# Patient Record
Sex: Female | Born: 1990 | Race: White | Hispanic: No | Marital: Married | Smoking: Never smoker
Health system: Southern US, Community
[De-identification: ages and names within clinical notes are randomized; demographics above are authoritative.]

## PROBLEM LIST (undated history)

## (undated) ENCOUNTER — Inpatient Hospital Stay (HOSPITAL_COMMUNITY): Payer: Self-pay

---

## 2017-07-15 ENCOUNTER — Other Ambulatory Visit: Payer: Self-pay

## 2017-07-15 ENCOUNTER — Emergency Department (HOSPITAL_COMMUNITY)
Admission: EM | Admit: 2017-07-15 | Discharge: 2017-07-15 | Disposition: A | Discharge status: Home / Self Care | Payer: BC Managed Care – PPO | Attending: Emergency Medicine | Admitting: Emergency Medicine

## 2017-07-15 ENCOUNTER — Encounter (HOSPITAL_COMMUNITY): Payer: Self-pay | Admitting: Emergency Medicine

## 2017-07-15 ENCOUNTER — Emergency Department (HOSPITAL_COMMUNITY): Payer: BC Managed Care – PPO

## 2017-07-15 DIAGNOSIS — Z3A01 Less than 8 weeks gestation of pregnancy: Secondary | ICD-10-CM | POA: Diagnosis not present

## 2017-07-15 DIAGNOSIS — O9989 Other specified diseases and conditions complicating pregnancy, childbirth and the puerperium: Secondary | ICD-10-CM | POA: Diagnosis not present

## 2017-07-15 DIAGNOSIS — R1031 Right lower quadrant pain: Secondary | ICD-10-CM | POA: Insufficient documentation

## 2017-07-15 LAB — URINALYSIS, ROUTINE W REFLEX MICROSCOPIC
BACTERIA UA: NONE SEEN
Bilirubin Urine: NEGATIVE
Glucose, UA: NEGATIVE mg/dL
Hgb urine dipstick: NEGATIVE
Ketones, ur: NEGATIVE mg/dL
Nitrite: NEGATIVE
Protein, ur: NEGATIVE mg/dL
SPECIFIC GRAVITY, URINE: 1.021 (ref 1.005–1.030)
pH: 7 (ref 5.0–8.0)

## 2017-07-15 LAB — COMPREHENSIVE METABOLIC PANEL
ALBUMIN: 4.1 g/dL (ref 3.5–5.0)
ALT: 24 U/L (ref 14–54)
AST: 22 U/L (ref 15–41)
Alkaline Phosphatase: 52 U/L (ref 38–126)
Anion gap: 8 (ref 5–15)
BILIRUBIN TOTAL: 0.4 mg/dL (ref 0.3–1.2)
BUN: 6 mg/dL (ref 6–20)
CO2: 24 mmol/L (ref 22–32)
Calcium: 9.2 mg/dL (ref 8.9–10.3)
Chloride: 105 mmol/L (ref 101–111)
Creatinine, Ser: 0.78 mg/dL (ref 0.44–1.00)
GFR calc Af Amer: >60 mL/min (ref >60)
GFR calc non Af Amer: >60 mL/min (ref >60)
GLUCOSE: 99 mg/dL (ref 65–99)
POTASSIUM: 4 mmol/L (ref 3.5–5.1)
SODIUM: 137 mmol/L (ref 135–145)
Total Protein: 6.8 g/dL (ref 6.5–8.1)

## 2017-07-15 LAB — LIPASE, BLOOD: Lipase: 34 U/L (ref 11–51)

## 2017-07-15 LAB — I-STAT BETA HCG BLOOD, ED (MC, WL, AP ONLY)

## 2017-07-15 LAB — CBC
HEMATOCRIT: 40.8 % (ref 36.0–46.0)
HEMOGLOBIN: 13.7 g/dL (ref 12.0–15.0)
MCH: 29.7 pg (ref 26.0–34.0)
MCHC: 33.6 g/dL (ref 30.0–36.0)
MCV: 88.3 fL (ref 78.0–100.0)
Platelets: 201 10*3/uL (ref 150–400)
RBC: 4.62 MIL/uL (ref 3.87–5.11)
RDW: 11.9 % (ref 11.5–15.5)
WBC: 6.8 10*3/uL (ref 4.0–10.5)

## 2017-07-15 NOTE — ED Provider Notes (Signed)
[facility] EMERGENCY DEPARTMENT Provider Note   CSN: [id number] Arrival date & time: 07/15/17  0827     History   Chief Complaint Chief Complaint  Patient presents with  . Groin Pain    HPI Donna Cameron is a 27 y.o. female with no significant past medical history, who presents to ED for evaluation of sharp lower right-sided pain.  Symptoms yesterday but they improved there is.  She woke up this morning with similar but worse pain.  She she has not taken any medications to help with her symptoms.  States that her last menstrual cycle was on April 26.  She has taken a number of positive pregnancy test in the past several weeks.  This be her first pregnancy.  She is scheduled to meet with her OB/GYN next week.  She denies any vaginal discharge, abnormal vaginal bleeding, vomiting, diarrhea, constipation, fevers, dysuria, hematuria.  HPI  History reviewed. No pertinent past medical history.  There are no active problems to display for this patient.   History reviewed. No pertinent surgical history.   OB History    Gravida  1   Para      Term      Preterm      AB      Living        SAB      TAB      Ectopic      Multiple      Live Births               Home Medications    Prior to Admission medications   Not on File    Family History No family history on file.  Social History Social History   Tobacco Use  . Smoking status: Not on file  Substance Use Topics  . Alcohol use: Not on file  . Drug use: Not on file     Allergies   Patient has no known allergies.   Review of Systems Review of Systems  Constitutional: Negative for appetite change, chills and fever.  HENT: Negative for ear pain, rhinorrhea, sneezing and sore throat.   Eyes: Negative for photophobia and visual disturbance.  Respiratory: Negative for cough, chest tightness, shortness of breath and wheezing.   Cardiovascular: Negative for chest pain and  palpitations.  Gastrointestinal: Positive for abdominal pain. Negative for blood in stool, constipation, diarrhea, nausea and vomiting.  Genitourinary: Negative for dysuria, flank pain, hematuria, urgency, vaginal bleeding, vaginal discharge and vaginal pain.  Musculoskeletal: Negative for myalgias.  Skin: Negative for rash.  Neurological: Negative for dizziness, weakness and light-headedness.     Physical Exam Updated Vital Signs BP 113/69   Pulse 74   Temp 98.2 F (36.8 C) (Oral)   Resp 14   Ht 5' 5.25" (1.657 m)   Wt 79.4 kg (175 lb)   LMP 06/06/2017 (Exact Date)   SpO2 100%   BMI 28.90 kg/m   Physical Exam  Constitutional: She appears well-developed and well-nourished. No distress.  HENT:  Head: Normocephalic and atraumatic.  Nose: Nose normal.  Eyes: Conjunctivae and EOM are normal. Right eye exhibits no discharge. Left eye exhibits no discharge. No scleral icterus.  Neck: Normal range of motion. Neck supple.  Cardiovascular: Normal rate, regular rhythm, normal heart sounds and intact distal pulses. Exam reveals no gallop and no friction rub.  No murmur heard. Pulmonary/Chest: Effort normal and breath sounds normal. No respiratory distress.  Abdominal: Soft. Bowel sounds are normal. She exhibits  no distension. There is no tenderness. There is no guarding.  No abdominal tenderness to palpation.  No CVA tenderness.  Musculoskeletal: Normal range of motion. She exhibits no edema.  Neurological: She is alert. She exhibits normal muscle tone. Coordination normal.  Skin: Skin is warm and dry. No rash noted.  Psychiatric: She has a normal mood and affect.  Nursing note and vitals reviewed.    ED Treatments / Results  Labs (all labs ordered are listed, but only abnormal results are displayed) Labs Reviewed  URINALYSIS, ROUTINE W REFLEX MICROSCOPIC - Abnormal; Notable for the following components:      Result Value   APPearance HAZY (*)    Leukocytes, UA TRACE (*)     All other components within normal limits  I-STAT BETA HCG BLOOD, ED ([facility], [facility], [facility] ONLY) - Abnormal; Notable for the following components:   I-stat hCG, quantitative >2,000.0 (*)    All other components within normal limits  URINE CULTURE  LIPASE, BLOOD  COMPREHENSIVE METABOLIC PANEL  CBC    EKG None  Radiology US Ob Comp < 14 Wks  Result Date: 07/15/2017 CLINICAL DATA:  Right pelvic pain. EXAM: OBSTETRIC <14 WK [place] AND TRANSVAGINAL OB US TECHNIQUE: Both transabdominal and transvaginal ultrasound examinations were performed for complete evaluation of the gestation as well as the maternal uterus, adnexal regions, and pelvic cul-de-sac. Transvaginal technique was performed to assess early pregnancy. COMPARISON:  None. FINDINGS: Intrauterine gestational sac: Single Yolk sac:  Visualized. Embryo:  Not Visualized. MSD: 7.3 mm   5 w   3 d Subchorionic hemorrhage:  None visualized. Maternal uterus/adnexae: Probable small fibroid in the posterior uterus. The ovaries are normal in appearance. IMPRESSION: Single IUP. A gestational sac and yolk sac are seen. No fetal pole at this time. Electronically Signed   By: Dorise Bullion III M.D   On: 07/15/2017 11:00   US Ob Transvaginal  Result Date: 07/15/2017 CLINICAL DATA:  Right pelvic pain. EXAM: OBSTETRIC <14 WK [place] AND TRANSVAGINAL OB US TECHNIQUE: Both transabdominal and transvaginal ultrasound examinations were performed for complete evaluation of the gestation as well as the maternal uterus, adnexal regions, and pelvic cul-de-sac. Transvaginal technique was performed to assess early pregnancy. COMPARISON:  None. FINDINGS: Intrauterine gestational sac: Single Yolk sac:  Visualized. Embryo:  Not Visualized. MSD: 7.3 mm   5 w   3 d Subchorionic hemorrhage:  None visualized. Maternal uterus/adnexae: Probable small fibroid in the posterior uterus. The ovaries are normal in appearance. IMPRESSION: Single IUP. A gestational sac and yolk sac are seen. No fetal pole at  this time. Electronically Signed   By: Dorise Bullion III M.D   On: 07/15/2017 11:00    Procedures Procedures (including critical care time)  Medications Ordered in ED Medications - No data to display   Initial Impression / Assessment and Plan / ED Course  I have reviewed the triage vital signs and the nursing notes.  Pertinent labs & imaging results that were available during my care of the patient were reviewed by me and considered in my medical decision making (see chart for details).     Patient presents to ED for evaluation of sharp lower right-sided domino pain.  Symptoms were evident yesterday but improved without any measures.  She woke up this morning with similar but worse pain.  Has not taken any medications to help with her symptoms.  Her last menstrual cycle was approximately 6 weeks ago.  She has taken numerous pregnancy tests that were all  positive in the past few weeks.  This will be her first pregnancy.  On physical exam she is overall well-appearing.  She has no abdominal tenderness to palpation and no CVA tenderness noted.  Vital signs within normal limits.  Patient's lab work significant for hCG levels greater than 2000.  CBC, BMP, lipase unremarkable.  Urine shows trace leukocytes but negative bacteria and nitrates.  Will send for culture.  Patient denies any dysuria at this time.  Pelvic ultrasound shows single IUP at 5 weeks and 3 days.  I doubt appendicitis, other surgical or emergent cause of her symptoms based on unremarkable and reassuring lab work and vital signs. Patient is currently on prenatal vitamins and folic acid.  Encouraged her to follow-up with her OB/GYN for further evaluation and to establish care.  Advised to return to ED for any severe worsening symptoms.  Portions of this note were generated with [employer]. Dictation errors may occur despite best attempts at proofreading.   Final Clinical Impressions(s) / ED Diagnoses   Final  diagnoses:  Less than [redacted] weeks gestation of pregnancy    ED Discharge Orders    None       Delia Heady, PA-C 07/15/17 [place], MD 07/15/17 1620

## 2017-07-15 NOTE — ED Notes (Addendum)
Pt stable, ambulatory, and verbalizes understanding of d/c instructions.  Signature pad not working, pt unable to sign.

## 2017-07-15 NOTE — ED Triage Notes (Signed)
Patient six weeks pregnant, has not seen OB, does not have confirmed due date. Complains of intermittent sharp pain in right groin, states she thinks the pain is her ovary that started in the middle of the night last night.

## 2017-07-15 NOTE — ED Notes (Signed)
Patient transported to Ultrasound 

## 2017-07-15 NOTE — Discharge Instructions (Signed)
Your ultrasound today showed a single gestational sac at 5 weeks and 3 days. Follow-up with your OB/GYN for further evaluation. Take Tylenol as needed for pain. Return to ED for worsening symptoms, vaginal bleeding, increased vomiting, lightheadedness or loss of consciousness.

## 2017-07-16 LAB — URINE CULTURE

## 2017-08-02 LAB — OB RESULTS CONSOLE ANTIBODY SCREEN: ANTIBODY SCREEN: NEGATIVE

## 2017-08-02 LAB — OB RESULTS CONSOLE GC/CHLAMYDIA
Chlamydia: NEGATIVE
Gonorrhea: NEGATIVE

## 2017-08-02 LAB — OB RESULTS CONSOLE ABO/RH: RH TYPE: POSITIVE

## 2017-08-02 LAB — OB RESULTS CONSOLE HEPATITIS B SURFACE ANTIGEN: Hepatitis B Surface Ag: NEGATIVE

## 2017-08-02 LAB — OB RESULTS CONSOLE RPR: RPR: NONREACTIVE

## 2017-08-02 LAB — OB RESULTS CONSOLE HIV ANTIBODY (ROUTINE TESTING): HIV: NONREACTIVE

## 2017-08-02 LAB — OB RESULTS CONSOLE RUBELLA ANTIBODY, IGM: RUBELLA: IMMUNE

## 2017-11-09 ENCOUNTER — Inpatient Hospital Stay (HOSPITAL_COMMUNITY): Payer: BC Managed Care – PPO

## 2017-11-09 ENCOUNTER — Other Ambulatory Visit: Payer: Self-pay

## 2017-11-09 ENCOUNTER — Observation Stay (HOSPITAL_COMMUNITY)
Admission: AD | Admit: 2017-11-09 | Discharge: 2017-11-10 | Disposition: A | Discharge status: Home / Self Care | Payer: BC Managed Care – PPO | Source: Ambulatory Visit | Attending: Obstetrics and Gynecology | Admitting: Obstetrics and Gynecology

## 2017-11-09 ENCOUNTER — Encounter (HOSPITAL_COMMUNITY): Payer: Self-pay | Admitting: *Deleted

## 2017-11-09 DIAGNOSIS — O2302 Infections of kidney in pregnancy, second trimester: Secondary | ICD-10-CM | POA: Diagnosis not present

## 2017-11-09 DIAGNOSIS — N12 Tubulo-interstitial nephritis, not specified as acute or chronic: Secondary | ICD-10-CM | POA: Diagnosis present

## 2017-11-09 DIAGNOSIS — O26892 Other specified pregnancy related conditions, second trimester: Secondary | ICD-10-CM

## 2017-11-09 DIAGNOSIS — O9989 Other specified diseases and conditions complicating pregnancy, childbirth and the puerperium: Principal | ICD-10-CM | POA: Insufficient documentation

## 2017-11-09 DIAGNOSIS — Z3A22 22 weeks gestation of pregnancy: Secondary | ICD-10-CM | POA: Diagnosis not present

## 2017-11-09 DIAGNOSIS — Z79899 Other long term (current) drug therapy: Secondary | ICD-10-CM | POA: Diagnosis not present

## 2017-11-09 DIAGNOSIS — R109 Unspecified abdominal pain: Secondary | ICD-10-CM | POA: Insufficient documentation

## 2017-11-09 LAB — URINALYSIS, ROUTINE W REFLEX MICROSCOPIC
Bilirubin Urine: NEGATIVE
Glucose, UA: NEGATIVE mg/dL
Ketones, ur: 80 mg/dL — AB
Nitrite: NEGATIVE
PROTEIN: NEGATIVE mg/dL
Specific Gravity, Urine: 1.023 (ref 1.005–1.030)
pH: 5 (ref 5.0–8.0)

## 2017-11-09 LAB — ABO/RH: ABO/RH(D): B POS

## 2017-11-09 LAB — COMPREHENSIVE METABOLIC PANEL
ALBUMIN: 3.7 g/dL (ref 3.5–5.0)
ALT: 12 U/L (ref 0–44)
ANION GAP: 11 (ref 5–15)
AST: 17 U/L (ref 15–41)
Alkaline Phosphatase: 53 U/L (ref 38–126)
BILIRUBIN TOTAL: 0.8 mg/dL (ref 0.3–1.2)
BUN: 11 mg/dL (ref 6–20)
CALCIUM: 9.2 mg/dL (ref 8.9–10.3)
CO2: 20 mmol/L — AB (ref 22–32)
Chloride: 105 mmol/L (ref 98–111)
Creatinine, Ser: 0.69 mg/dL (ref 0.44–1.00)
GFR calc Af Amer: >60 mL/min (ref >60)
GFR calc non Af Amer: >60 mL/min (ref >60)
GLUCOSE: 106 mg/dL — AB (ref 70–99)
Potassium: 3.8 mmol/L (ref 3.5–5.1)
Sodium: 136 mmol/L (ref 135–145)
TOTAL PROTEIN: 7.2 g/dL (ref 6.5–8.1)

## 2017-11-09 LAB — CBC WITH DIFFERENTIAL/PLATELET
BASOS PCT: 0 %
Basophils Absolute: 0 10*3/uL (ref 0.0–0.1)
Eosinophils Absolute: 0 10*3/uL (ref 0.0–0.7)
Eosinophils Relative: 0 %
HEMATOCRIT: 34.5 % — AB (ref 36.0–46.0)
Hemoglobin: 12.1 g/dL (ref 12.0–15.0)
Lymphocytes Relative: 9 %
Lymphs Abs: 1.2 10*3/uL (ref 0.7–4.0)
MCH: 31.2 pg (ref 26.0–34.0)
MCHC: 35.1 g/dL (ref 30.0–36.0)
MCV: 88.9 fL (ref 78.0–100.0)
MONO ABS: 0.3 10*3/uL (ref 0.1–1.0)
MONOS PCT: 2 %
NEUTROS ABS: 12.1 10*3/uL — AB (ref 1.7–7.7)
Neutrophils Relative %: 89 %
Platelets: 197 10*3/uL (ref 150–400)
RBC: 3.88 MIL/uL (ref 3.87–5.11)
RDW: 13.4 % (ref 11.5–15.5)
WBC: 13.7 10*3/uL — ABNORMAL HIGH (ref 4.0–10.5)

## 2017-11-09 LAB — TYPE AND SCREEN
ABO/RH(D): B POS
ANTIBODY SCREEN: NEGATIVE

## 2017-11-09 LAB — WET PREP, GENITAL
CLUE CELLS WET PREP: NONE SEEN
Sperm: NONE SEEN
Trich, Wet Prep: NONE SEEN
YEAST WET PREP: NONE SEEN

## 2017-11-09 MED ORDER — PIPERACILLIN-TAZOBACTAM 3.375 G IVPB 30 MIN
3.3750 g | Freq: Once | INTRAVENOUS | Status: AC
  Administered 2017-11-09: 3.375 g via INTRAVENOUS
  Filled 2017-11-09: qty 50

## 2017-11-09 MED ORDER — ONDANSETRON 8 MG PO TBDP
8.0000 mg | ORAL_TABLET | Freq: Once | ORAL | Status: AC
  Administered 2017-11-09: 8 mg via ORAL
  Filled 2017-11-09: qty 1

## 2017-11-09 MED ORDER — ACETAMINOPHEN 325 MG PO TABS
650.0000 mg | ORAL_TABLET | ORAL | Status: DC | PRN
  Administered 2017-11-09 – 2017-11-10 (×2): 650 mg via ORAL
  Filled 2017-11-09 (×2): qty 2

## 2017-11-09 MED ORDER — HYDROMORPHONE HCL 1 MG/ML IJ SOLN
1.0000 mg | Freq: Once | INTRAMUSCULAR | Status: AC
  Administered 2017-11-09: 1 mg via INTRAVENOUS
  Filled 2017-11-09: qty 1

## 2017-11-09 MED ORDER — ACETAMINOPHEN 325 MG PO TABS: 650.0000 mg | ORAL_TABLET | ORAL | Status: DC | PRN

## 2017-11-09 MED ORDER — ZOLPIDEM TARTRATE 5 MG PO TABS: 5.0000 mg | ORAL_TABLET | Freq: Every evening | ORAL | Status: DC | PRN

## 2017-11-09 MED ORDER — PRENATAL MULTIVITAMIN CH: 1.0000 | ORAL_TABLET | Freq: Every day | ORAL | Status: DC

## 2017-11-09 MED ORDER — PIPERACILLIN-TAZOBACTAM 3.375 G IVPB
3.3750 g | Freq: Three times a day (TID) | INTRAVENOUS | Status: DC
  Administered 2017-11-09 – 2017-11-10 (×3): 3.375 g via INTRAVENOUS
  Filled 2017-11-09 (×2): qty 50

## 2017-11-09 MED ORDER — HYDROCODONE-ACETAMINOPHEN 5-325 MG PO TABS
2.0000 | ORAL_TABLET | Freq: Once | ORAL | Status: DC
  Filled 2017-11-09: qty 2

## 2017-11-09 MED ORDER — HYDROMORPHONE HCL 1 MG/ML IJ SOLN: 1.0000 mg | INTRAMUSCULAR | Status: DC | PRN

## 2017-11-09 MED ORDER — DOCUSATE SODIUM 100 MG PO CAPS
100.0000 mg | ORAL_CAPSULE | Freq: Every day | ORAL | Status: DC
  Administered 2017-11-10: 100 mg via ORAL
  Filled 2017-11-09: qty 1

## 2017-11-09 MED ORDER — CALCIUM CARBONATE ANTACID 500 MG PO CHEW: 2.0000 | CHEWABLE_TABLET | ORAL | Status: DC | PRN

## 2017-11-09 MED ORDER — LACTATED RINGERS IV SOLN
INTRAVENOUS | Status: DC
  Administered 2017-11-09 – 2017-11-10 (×2): via INTRAVENOUS

## 2017-11-09 MED ORDER — LACTATED RINGERS IV SOLN
INTRAVENOUS | Status: DC
  Administered 2017-11-09: 14:00:00 via INTRAVENOUS

## 2017-11-09 NOTE — MAU Note (Signed)
Patient reports sudden onset of severe left side flank pain at 1100 today.  Denies bleeding in urine.  Reports she thought it was the beginning of a UTI with some urinary frequency.

## 2017-11-09 NOTE — Progress Notes (Signed)
Pharmacy Antibiotic Note  Donna Cameron is a 27 y.o. female admitted on 11/09/2017 with pyelonephritis.  Pharmacy has been consulted for Zosyn dosing.  Plan: Zosyn 3.375 grams IV x1 over 30 min followed by Zosyn 3.375g IV q8h (4 hour infusion).     Temp (24hrs), Avg:98.3 F (36.8 C), Min:98.3 F (36.8 C), Max:98.3 F (36.8 C)  Recent Labs  Lab 11/09/17 1336  WBC 13.7*  CREATININE 0.69    CrCl cannot be calculated (Unknown ideal weight.).    No Known Allergies  Antimicrobials this admission: Zosyn 9/27 >>     Microbiology results: 11/09/17 UCx: pending  Thank you for allowing pharmacy to be a part of this patient's care.  Beryle Lathe 11/09/2017 3:40 PM

## 2017-11-09 NOTE — Progress Notes (Signed)
Patient now much more comfortable-took some Tylenol for mild low back pain>now OK  Vitals:   11/09/17 1600 11/09/17 1957  BP: 122/78 114/67  Pulse: (!) 101 100  Resp:  17  Temp: 98.5 F (36.9 C) 98.7 F (37.1 C)  SpO2: 100% 97%   Abdomen soft, NT Uterus NT Left flank NT  A/P: Flank pain-possible early pyelo vs small kidney stone         Continue IV fluids and ATB         Urine C&S sent         CBC in am         D/W patient

## 2017-11-09 NOTE — MAU Provider Note (Signed)
History     CSN: [id number]  Arrival date and time: 11/09/17 1235   First Provider Initiated Contact with Patient 11/09/17 1307      Chief Complaint  Patient presents with  . Flank Pain   HPI  Donna Cameron is a 27 y.o. G1P0 at [redacted]w[redacted]d who presents to MAU with chief complaint of severe left flank pain, radiating to left lower back, rated 9/10. This is a new problem, onset today at approximately 1100hrs. Denies urinary symptoms. Denies vaginal bleeding, leaking of fluid, decreased fetal movement, fever, falls, or recent illness.  Patient also complains of nausea/vomiting, new onset on arrival in MAU.   OB History    Gravida  1   Para      Term      Preterm      AB      Living        SAB      TAB      Ectopic      Multiple      Live Births              History reviewed. No pertinent past medical history.  History reviewed. No pertinent surgical history.  History reviewed. No pertinent family history.  Social History   Tobacco Use  . Smoking status: Never Smoker  . Smokeless tobacco: Never Used  Substance Use Topics  . Alcohol use: Not Currently  . Drug use: Never    Allergies: No Known Allergies  Medications Prior to Admission  Medication Sig Dispense Refill Last Dose  . Prenatal Vit-Fe Fumarate-FA (PRENATAL MULTIVITAMIN) TABS tablet Take 1 tablet by mouth daily at 12 noon.   11/09/2017 at Unknown time    Review of Systems  Constitutional: Negative for chills and fever.  Gastrointestinal: Positive for nausea and vomiting. Negative for abdominal pain.  Genitourinary: Negative for vaginal bleeding, vaginal discharge and vaginal pain.  Musculoskeletal: Positive for back pain.  All other systems reviewed and are negative.  Physical Exam   Pulse (!) 104, temperature 98.3 F (36.8 C), resp. rate 18, last menstrual period 06/06/2017, SpO2 96 %.  Physical Exam  Nursing note and vitals reviewed. Constitutional: She appears well-developed and  well-nourished.  Respiratory: Effort normal.  GI: She exhibits no distension. There is no tenderness. There is CVA tenderness. There is no rebound and no guarding.  Musculoskeletal: She exhibits tenderness.   Patient is barely able to tolerate light pressure to palpation on her left midback and LLQ. Curled in fetal position upon initial assessment.  MAU Course  Procedures  MDM Patient Vitals for the past 24 hrs:  Temp Pulse Resp SpO2  11/09/17 1252 98.3 F (36.8 C) (!) 104 18 96 %   Results for orders placed or performed during the hospital encounter of 11/09/17 (from the past 24 hour(s))  Wet prep, genital     Status: Abnormal   Collection Time: 11/09/17  1:33 PM  Result Value Ref Range   Yeast Wet Prep HPF POC NONE SEEN NONE SEEN   Trich, Wet Prep NONE SEEN NONE SEEN   Clue Cells Wet Prep HPF POC NONE SEEN NONE SEEN   WBC, Wet Prep HPF POC FEW (A) NONE SEEN   Sperm NONE SEEN   CBC with Differential/Platelet     Status: Abnormal   Collection Time: 11/09/17  1:36 PM  Result Value Ref Range   WBC 13.7 (H) 4.0 - 10.5 K/uL   RBC 3.88 3.87 - 5.11 MIL/uL   Hemoglobin 12.1  12.0 - 15.0 g/dL   HCT 34.5 (L) 36.0 - 46.0 %   MCV 88.9 78.0 - 100.0 fL   MCH 31.2 26.0 - 34.0 pg   MCHC 35.1 30.0 - 36.0 g/dL   RDW 13.4 11.5 - 15.5 %   Platelets 197 150 - 400 K/uL   Neutrophils Relative % 89 %   Neutro Abs 12.1 (H) 1.7 - 7.7 K/uL   Lymphocytes Relative 9 %   Lymphs Abs 1.2 0.7 - 4.0 K/uL   Monocytes Relative 2 %   Monocytes Absolute 0.3 0.1 - 1.0 K/uL   Eosinophils Relative 0 %   Eosinophils Absolute 0.0 0.0 - 0.7 K/uL   Basophils Relative 0 %   Basophils Absolute 0.0 0.0 - 0.1 K/uL  Comprehensive metabolic panel     Status: Abnormal   Collection Time: 11/09/17  1:36 PM  Result Value Ref Range   Sodium 136 135 - 145 mmol/L   Potassium 3.8 3.5 - 5.1 mmol/L   Chloride 105 98 - 111 mmol/L   CO2 20 (L) 22 - 32 mmol/L   Glucose, Bld 106 (H) 70 - 99 mg/dL   BUN 11 6 - 20 mg/dL    Creatinine, Ser 0.69 0.44 - 1.00 mg/dL   Calcium 9.2 8.9 - 10.3 mg/dL   Total Protein 7.2 6.5 - 8.1 g/dL   Albumin 3.7 3.5 - 5.0 g/dL   AST 17 15 - 41 U/L   ALT 12 0 - 44 U/L   Alkaline Phosphatase 53 38 - 126 U/L   Total Bilirubin 0.8 0.3 - 1.2 mg/dL   GFR calc non Af Amer >60 >60 mL/min   GFR calc Af Amer >60 >60 mL/min   Anion gap 11 5 - 15  Urinalysis, Routine w reflex microscopic     Status: Abnormal   Collection Time: 11/09/17  2:07 PM  Result Value Ref Range   Color, Urine YELLOW YELLOW   APPearance HAZY (A) CLEAR   Specific Gravity, Urine 1.023 1.005 - 1.030   pH 5.0 5.0 - 8.0   Glucose, UA NEGATIVE NEGATIVE mg/dL   Hgb urine dipstick MODERATE (A) NEGATIVE   Bilirubin Urine NEGATIVE NEGATIVE   Ketones, ur 80 (A) NEGATIVE mg/dL   Protein, ur NEGATIVE NEGATIVE mg/dL   Nitrite NEGATIVE NEGATIVE   Leukocytes, UA TRACE (A) NEGATIVE   RBC / HPF 6-10 0 - 5 RBC/hpf   WBC, UA 6-10 0 - 5 WBC/hpf   Bacteria, UA RARE (A) NONE SEEN   Squamous Epithelial / LPF 6-10 0 - 5   Mucus PRESENT    US Renal  Result Date: 11/09/2017 CLINICAL DATA:  Severe LEFT leg pain. Patient at 22 weeks 2 days gestational age. EXAM: RENAL / URINARY TRACT ULTRASOUND COMPLETE COMPARISON:  07/15/2017 OB ultrasound FINDINGS: Right Kidney: Length: 11.3 centimeters. Echogenicity within normal limits. No mass or hydronephrosis visualized. Left Kidney: Length: 11.2 centimeters. Echogenicity within normal limits. No mass or hydronephrosis visualized. Bladder: Decompressed and not evaluable. IMPRESSION: Normal appearance of both kidneys.  No hydronephrosis. Electronically Signed   By: Nolon Nations M.D.   On: 11/09/2017 15:09    Assessment and Plan  --27 y.o. G1P0 @ [redacted]w[redacted]d  --No evidence of kidney stone on renal US --Discussed lab results with Dr. Gaetano Net, concern for Pyelonephritis --Flank pain reduced to 3/10 with IV Dilaudid administration --Urine culture pending, will forward results to [facility] and follow up  PRN --Admit to Antepartum for 12-24 hours observation, IV antibiotics and pain management. Orders  placed  Darlina Rumpf, CNM 11/09/2017, 4:05 PM

## 2017-11-09 NOTE — H&P (Signed)
Donna Cameron is a 27 y.o. G1P0 at [redacted]w[redacted]d who presents to MAU with chief complaint of severe left flank pain, radiating to left lower back, rated 9/10. This is a new problem, onset today at approximately 1100hrs. Denies urinary symptoms. Denies vaginal bleeding, leaking of fluid, decreased fetal movement, fever, falls, or recent illness.  Patient also complains of nausea/vomiting, new onset on arrival in MAU.           OB History    Gravida  1   Para      Term      Preterm      AB      Living        SAB      TAB      Ectopic      Multiple      Live Births              History reviewed. No pertinent past medical history.  History reviewed. No pertinent surgical history.  History reviewed. No pertinent family history.  Social History       Tobacco Use  . Smoking status: Never Smoker  . Smokeless tobacco: Never Used  Substance Use Topics  . Alcohol use: Not Currently  . Drug use: Never    Allergies: No Known Allergies         Medications Prior to Admission  Medication Sig Dispense Refill Last Dose  . Prenatal Vit-Fe Fumarate-FA (PRENATAL MULTIVITAMIN) TABS tablet Take 1 tablet by mouth daily at 12 noon.   11/09/2017 at Unknown time    Review of Systems  Constitutional: Negative for chills and fever.  Gastrointestinal: Positive for nausea and vomiting. Negative for abdominal pain.  Genitourinary: Negative for vaginal bleeding, vaginal discharge and vaginal pain.  Musculoskeletal: Positive for back pain.  All other systems reviewed and are negative.  Physical Exam   Pulse (!) 104, temperature 98.3 F (36.8 C), resp. rate 18, last menstrual period 06/06/2017, SpO2 96 %.  Physical Exam  Nursing note and vitals reviewed. Constitutional: She appears well-developed and well-nourished.  Respiratory: Effort normal.  GI: She exhibits no distension. There is no tenderness. There is CVA tenderness. There is no rebound and no guarding.   Musculoskeletal: She exhibits tenderness.   Patient is barely able to tolerate light pressure to palpation on her left midback and LLQ. Curled in fetal position upon initial assessment.  MAU Course  Procedures  MDM Patient Vitals for the past 24 hrs:  Temp Pulse Resp SpO2  11/09/17 1252 98.3 F (36.8 C) (!) 104 18 96 %   LabResultsLast24Hours       Results for orders placed or performed during the hospital encounter of 11/09/17 (from the past 24 hour(s))  Wet prep, genital     Status: Abnormal   Collection Time: 11/09/17  1:33 PM  Result Value Ref Range   Yeast Wet Prep HPF POC NONE SEEN NONE SEEN   Trich, Wet Prep NONE SEEN NONE SEEN   Clue Cells Wet Prep HPF POC NONE SEEN NONE SEEN   WBC, Wet Prep HPF POC FEW (A) NONE SEEN   Sperm NONE SEEN   CBC with Differential/Platelet     Status: Abnormal   Collection Time: 11/09/17  1:36 PM  Result Value Ref Range   WBC 13.7 (H) 4.0 - 10.5 K/uL   RBC 3.88 3.87 - 5.11 MIL/uL   Hemoglobin 12.1 12.0 - 15.0 g/dL   HCT 34.5 (L) 36.0 - 46.0 %   MCV 88.9  78.0 - 100.0 fL   MCH 31.2 26.0 - 34.0 pg   MCHC 35.1 30.0 - 36.0 g/dL   RDW 13.4 11.5 - 15.5 %   Platelets 197 150 - 400 K/uL   Neutrophils Relative % 89 %   Neutro Abs 12.1 (H) 1.7 - 7.7 K/uL   Lymphocytes Relative 9 %   Lymphs Abs 1.2 0.7 - 4.0 K/uL   Monocytes Relative 2 %   Monocytes Absolute 0.3 0.1 - 1.0 K/uL   Eosinophils Relative 0 %   Eosinophils Absolute 0.0 0.0 - 0.7 K/uL   Basophils Relative 0 %   Basophils Absolute 0.0 0.0 - 0.1 K/uL  Comprehensive metabolic panel     Status: Abnormal   Collection Time: 11/09/17  1:36 PM  Result Value Ref Range   Sodium 136 135 - 145 mmol/L   Potassium 3.8 3.5 - 5.1 mmol/L   Chloride 105 98 - 111 mmol/L   CO2 20 (L) 22 - 32 mmol/L   Glucose, Bld 106 (H) 70 - 99 mg/dL   BUN 11 6 - 20 mg/dL   Creatinine, Ser 0.69 0.44 - 1.00 mg/dL   Calcium 9.2 8.9 - 10.3 mg/dL   Total Protein 7.2  6.5 - 8.1 g/dL   Albumin 3.7 3.5 - 5.0 g/dL   AST 17 15 - 41 U/L   ALT 12 0 - 44 U/L   Alkaline Phosphatase 53 38 - 126 U/L   Total Bilirubin 0.8 0.3 - 1.2 mg/dL   GFR calc non Af Amer >60 >60 mL/min   GFR calc Af Amer >60 >60 mL/min   Anion gap 11 5 - 15  Urinalysis, Routine w reflex microscopic     Status: Abnormal   Collection Time: 11/09/17  2:07 PM  Result Value Ref Range   Color, Urine YELLOW YELLOW   APPearance HAZY (A) CLEAR   Specific Gravity, Urine 1.023 1.005 - 1.030   pH 5.0 5.0 - 8.0   Glucose, UA NEGATIVE NEGATIVE mg/dL   Hgb urine dipstick MODERATE (A) NEGATIVE   Bilirubin Urine NEGATIVE NEGATIVE   Ketones, ur 80 (A) NEGATIVE mg/dL   Protein, ur NEGATIVE NEGATIVE mg/dL   Nitrite NEGATIVE NEGATIVE   Leukocytes, UA TRACE (A) NEGATIVE   RBC / HPF 6-10 0 - 5 RBC/hpf   WBC, UA 6-10 0 - 5 WBC/hpf   Bacteria, UA RARE (A) NONE SEEN   Squamous Epithelial / LPF 6-10 0 - 5   Mucus PRESENT      US Renal  Result Date: 11/09/2017 CLINICAL DATA:  Severe LEFT leg pain. Patient at 22 weeks 2 days gestational age. EXAM: RENAL / URINARY TRACT ULTRASOUND COMPLETE COMPARISON:  07/15/2017 OB ultrasound FINDINGS: Right Kidney: Length: 11.3 centimeters. Echogenicity within normal limits. No mass or hydronephrosis visualized. Left Kidney: Length: 11.2 centimeters. Echogenicity within normal limits. No mass or hydronephrosis visualized. Bladder: Decompressed and not evaluable. IMPRESSION: Normal appearance of both kidneys.  No hydronephrosis. Electronically Signed   By: Nolon Nations M.D.   On: 11/09/2017 15:09    Assessment and Plan  --27 y.o. G1P0 @ [redacted]w[redacted]d  --No evidence of kidney stone on renal US -- Possible Pyelonephritis --Flank pain reduced to 3/10 with IV Dilaudid administration --Urine culture pending --Admit to Antepartum for 12-24 hours observation, IV antibiotics and pain management

## 2017-11-10 DIAGNOSIS — O2302 Infections of kidney in pregnancy, second trimester: Secondary | ICD-10-CM | POA: Diagnosis present

## 2017-11-10 LAB — CBC WITH DIFFERENTIAL/PLATELET
BASOS PCT: 0 %
Basophils Absolute: 0 10*3/uL (ref 0.0–0.1)
EOS ABS: 0.1 10*3/uL (ref 0.0–0.7)
Eosinophils Relative: 1 %
HCT: 29.4 % — ABNORMAL LOW (ref 36.0–46.0)
HEMOGLOBIN: 10.6 g/dL — AB (ref 12.0–15.0)
Lymphocytes Relative: 21 %
Lymphs Abs: 2.2 10*3/uL (ref 0.7–4.0)
MCH: 32 pg (ref 26.0–34.0)
MCHC: 36.1 g/dL — AB (ref 30.0–36.0)
MCV: 88.8 fL (ref 78.0–100.0)
Monocytes Absolute: 0.4 10*3/uL (ref 0.1–1.0)
Monocytes Relative: 4 %
NEUTROS PCT: 74 %
Neutro Abs: 7.7 10*3/uL (ref 1.7–7.7)
Platelets: 171 10*3/uL (ref 150–400)
RBC: 3.31 MIL/uL — AB (ref 3.87–5.11)
RDW: 13.5 % (ref 11.5–15.5)
WBC: 10.4 10*3/uL (ref 4.0–10.5)

## 2017-11-10 LAB — URINE CULTURE: Culture: NO GROWTH

## 2017-11-10 MED ORDER — CEPHALEXIN 500 MG PO CAPS: 500.0000 mg | ORAL_CAPSULE | Freq: Four times a day (QID) | ORAL | 0 refills | Status: DC

## 2017-11-10 MED ORDER — ACETAMINOPHEN 325 MG PO TABS: 650.0000 mg | ORAL_TABLET | Freq: Four times a day (QID) | ORAL | 1 refills | Status: DC | PRN

## 2017-11-10 NOTE — Progress Notes (Signed)
Feels great  Vitals:   11/10/17 0614 11/10/17 0757  BP: 98/61 (!) 102/58  Pulse: 97 92  Resp: 18 16  Temp: 98.5 F (36.9 C) 98.5 F (36.9 C)  SpO2: 99% 98%   Back no CVAT Flank NT Uterus soft NT  Results for orders placed or performed during the hospital encounter of 11/09/17 (from the past 48 hour(s))  Wet prep, genital     Status: Abnormal   Collection Time: 11/09/17  1:33 PM  Result Value Ref Range   Yeast Wet Prep HPF POC NONE SEEN NONE SEEN   Trich, Wet Prep NONE SEEN NONE SEEN   Clue Cells Wet Prep HPF POC NONE SEEN NONE SEEN   WBC, Wet Prep HPF POC FEW (A) NONE SEEN    Comment: MANY BACTERIA SEEN   Sperm NONE SEEN     Comment: Performed at [facility], [address] [place], [place] [address]  CBC with Differential/Platelet     Status: Abnormal   Collection Time: 11/09/17  1:36 PM  Result Value Ref Range   WBC 13.7 (H) 4.0 - 10.5 K/uL   RBC 3.88 3.87 - 5.11 MIL/uL   Hemoglobin 12.1 12.0 - 15.0 g/dL   HCT 34.5 (L) 36.0 - 46.0 %   MCV 88.9 78.0 - 100.0 fL   MCH 31.2 26.0 - 34.0 pg   MCHC 35.1 30.0 - 36.0 g/dL   RDW 13.4 11.5 - 15.5 %   Platelets 197 150 - 400 K/uL   Neutrophils Relative % 89 %   Neutro Abs 12.1 (H) 1.7 - 7.7 K/uL   Lymphocytes Relative 9 %   Lymphs Abs 1.2 0.7 - 4.0 K/uL   Monocytes Relative 2 %   Monocytes Absolute 0.3 0.1 - 1.0 K/uL   Eosinophils Relative 0 %   Eosinophils Absolute 0.0 0.0 - 0.7 K/uL   Basophils Relative 0 %   Basophils Absolute 0.0 0.0 - 0.1 K/uL    Comment: Performed at [facility], [address], [place], [place] [address]  Comprehensive metabolic panel     Status: Abnormal   Collection Time: 11/09/17  1:36 PM  Result Value Ref Range   Sodium 136 135 - 145 mmol/L   Potassium 3.8 3.5 - 5.1 mmol/L   Chloride 105 98 - 111 mmol/L   CO2 20 (L) 22 - 32 mmol/L   Glucose, Bld 106 (H) 70 - 99 mg/dL   BUN 11 6 - 20 mg/dL   Creatinine, Ser 0.69 0.44 - 1.00 mg/dL   Calcium 9.2 8.9 - 10.3 mg/dL   Total Protein  7.2 6.5 - 8.1 g/dL   Albumin 3.7 3.5 - 5.0 g/dL   AST 17 15 - 41 U/L   ALT 12 0 - 44 U/L   Alkaline Phosphatase 53 38 - 126 U/L   Total Bilirubin 0.8 0.3 - 1.2 mg/dL   GFR calc non Af Amer >60 >60 mL/min   GFR calc Af Amer >60 >60 mL/min    Comment: (NOTE) The eGFR has been calculated using the CKD EPI equation. This calculation has not been validated in all clinical situations. eGFR's persistently <60 mL/min signify possible Chronic Kidney Disease.    Anion gap 11 5 - 15    Comment: Performed at [facility], [address] [place], [place] [address]  Urinalysis, Routine w reflex microscopic     Status: Abnormal   Collection Time: 11/09/17  2:07 PM  Result Value Ref Range   Color, Urine YELLOW YELLOW  APPearance HAZY (A) CLEAR   Specific Gravity, Urine 1.023 1.005 - 1.030   pH 5.0 5.0 - 8.0   Glucose, UA NEGATIVE NEGATIVE mg/dL   Hgb urine dipstick MODERATE (A) NEGATIVE   Bilirubin Urine NEGATIVE NEGATIVE   Ketones, ur 80 (A) NEGATIVE mg/dL   Protein, ur NEGATIVE NEGATIVE mg/dL   Nitrite NEGATIVE NEGATIVE   Leukocytes, UA TRACE (A) NEGATIVE   RBC / HPF 6-10 0 - 5 RBC/hpf   WBC, UA 6-10 0 - 5 WBC/hpf   Bacteria, UA RARE (A) NONE SEEN   Squamous Epithelial / LPF 6-10 0 - 5   Mucus PRESENT     Comment: Performed at [facility], [address] [place], [place] [address]  Type and screen [place]     Status: None   Collection Time: 11/09/17  3:42 PM  Result Value Ref Range   ABO/RH(D) B POS    Antibody Screen NEG    Sample Expiration      11/12/2017 Performed at [facility], [address], [place], [place] [address]   ABO/Rh     Status: None   Collection Time: 11/09/17  3:42 PM  Result Value Ref Range   ABO/RH(D)      B POS Performed at [facility], [place], [place] [address]   CBC with Differential/Platelet     Status: Abnormal   Collection Time: 11/10/17  5:51 AM  Result Value Ref Range   WBC 10.4 4.0 -  10.5 K/uL   RBC 3.31 (L) 3.87 - 5.11 MIL/uL   Hemoglobin 10.6 (L) 12.0 - 15.0 g/dL   HCT 29.4 (L) 36.0 - 46.0 %   MCV 88.8 78.0 - 100.0 fL   MCH 32.0 26.0 - 34.0 pg   MCHC 36.1 (H) 30.0 - 36.0 g/dL   RDW 13.5 11.5 - 15.5 %   Platelets 171 150 - 400 K/uL   Neutrophils Relative % 74 %   Neutro Abs 7.7 1.7 - 7.7 K/uL   Lymphocytes Relative 21 %   Lymphs Abs 2.2 0.7 - 4.0 K/uL   Monocytes Relative 4 %   Monocytes Absolute 0.4 0.1 - 1.0 K/uL   Eosinophils Relative 1 %   Eosinophils Absolute 0.1 0.0 - 0.7 K/uL   Basophils Relative 0 %   Basophils Absolute 0.0 0.0 - 0.1 K/uL    Comment: Performed at [facility], [address] [place], [place] [address]   Urine C&S pending  A/P: Pyelonephritis         Possible kidney stone         D/C home-Kef[place]         FU office 4-6 days

## 2017-11-10 NOTE — Discharge Summary (Signed)
Physician Discharge Summary  Patient ID: Donna Cameron MRN: [id number] DOB/AGE: 08-16-1990 27 y.o.  Admit date: 11/09/2017 Discharge date: 11/10/2017  Admission Diagnoses: pyelonephritis Possible kidney stone  Discharge Diagnoses:  Active Problems:   Pyelonephritis   Pyelonephritis affecting pregnancy in second trimester   Discharged Condition: good  Hospital Course: observation for IV fluids and ATB. Had rapid resolution of her pain taking only Tylenol while on floor. Voiding, ambulating and tolerating diet.  Consults: None  Significant Diagnostic Studies: labs:  Results for orders placed or performed during the hospital encounter of 11/09/17 (from the past 72 hour(s))  Wet prep, genital     Status: Abnormal   Collection Time: 11/09/17  1:33 PM  Result Value Ref Range   Yeast Wet Prep HPF POC NONE SEEN NONE SEEN   Trich, Wet Prep NONE SEEN NONE SEEN   Clue Cells Wet Prep HPF POC NONE SEEN NONE SEEN   WBC, Wet Prep HPF POC FEW (A) NONE SEEN    Comment: MANY BACTERIA SEEN   Sperm NONE SEEN     Comment: Performed at [facility], [address] [place], [place] [address]  CBC with Differential/Platelet     Status: Abnormal   Collection Time: 11/09/17  1:36 PM  Result Value Ref Range   WBC 13.7 (H) 4.0 - 10.5 K/uL   RBC 3.88 3.87 - 5.11 MIL/uL   Hemoglobin 12.1 12.0 - 15.0 g/dL   HCT 34.5 (L) 36.0 - 46.0 %   MCV 88.9 78.0 - 100.0 fL   MCH 31.2 26.0 - 34.0 pg   MCHC 35.1 30.0 - 36.0 g/dL   RDW 13.4 11.5 - 15.5 %   Platelets 197 150 - 400 K/uL   Neutrophils Relative % 89 %   Neutro Abs 12.1 (H) 1.7 - 7.7 K/uL   Lymphocytes Relative 9 %   Lymphs Abs 1.2 0.7 - 4.0 K/uL   Monocytes Relative 2 %   Monocytes Absolute 0.3 0.1 - 1.0 K/uL   Eosinophils Relative 0 %   Eosinophils Absolute 0.0 0.0 - 0.7 K/uL   Basophils Relative 0 %   Basophils Absolute 0.0 0.0 - 0.1 K/uL    Comment: Performed at [facility], [address], [place], [place] [address]  Comprehensive  metabolic panel     Status: Abnormal   Collection Time: 11/09/17  1:36 PM  Result Value Ref Range   Sodium 136 135 - 145 mmol/L   Potassium 3.8 3.5 - 5.1 mmol/L   Chloride 105 98 - 111 mmol/L   CO2 20 (L) 22 - 32 mmol/L   Glucose, Bld 106 (H) 70 - 99 mg/dL   BUN 11 6 - 20 mg/dL   Creatinine, Ser 0.69 0.44 - 1.00 mg/dL   Calcium 9.2 8.9 - 10.3 mg/dL   Total Protein 7.2 6.5 - 8.1 g/dL   Albumin 3.7 3.5 - 5.0 g/dL   AST 17 15 - 41 U/L   ALT 12 0 - 44 U/L   Alkaline Phosphatase 53 38 - 126 U/L   Total Bilirubin 0.8 0.3 - 1.2 mg/dL   GFR calc non Af Amer >60 >60 mL/min   GFR calc Af Amer >60 >60 mL/min    Comment: (NOTE) The eGFR has been calculated using the CKD EPI equation. This calculation has not been validated in all clinical situations. eGFR's persistently <60 mL/min signify possible Chronic Kidney Disease.    Anion gap 11 5 - 15    Comment: Performed at [facility], [place]  [address] [place], [place] [address]  Urinalysis, Routine w reflex microscopic     Status: Abnormal   Collection Time: 11/09/17  2:07 PM  Result Value Ref Range   Color, Urine YELLOW YELLOW   APPearance HAZY (A) CLEAR   Specific Gravity, Urine 1.023 1.005 - 1.030   pH 5.0 5.0 - 8.0   Glucose, UA NEGATIVE NEGATIVE mg/dL   Hgb urine dipstick MODERATE (A) NEGATIVE   Bilirubin Urine NEGATIVE NEGATIVE   Ketones, ur 80 (A) NEGATIVE mg/dL   Protein, ur NEGATIVE NEGATIVE mg/dL   Nitrite NEGATIVE NEGATIVE   Leukocytes, UA TRACE (A) NEGATIVE   RBC / HPF 6-10 0 - 5 RBC/hpf   WBC, UA 6-10 0 - 5 WBC/hpf   Bacteria, UA RARE (A) NONE SEEN   Squamous Epithelial / LPF 6-10 0 - 5   Mucus PRESENT     Comment: Performed at [facility], [address], [place], [place] [address]  Type and screen [place]     Status: None   Collection Time: 11/09/17  3:42 PM  Result Value Ref Range   ABO/RH(D) B POS    Antibody Screen NEG    Sample Expiration      11/12/2017 Performed at [facility], [address], [place], [place] [address]   ABO/Rh     Status: None   Collection Time: 11/09/17  3:42 PM  Result Value Ref Range   ABO/RH(D)      B POS Performed at [facility], [place], [place] [address]   CBC with Differential/Platelet     Status: Abnormal   Collection Time: 11/10/17  5:51 AM  Result Value Ref Range   WBC 10.4 4.0 - 10.5 K/uL   RBC 3.31 (L) 3.87 - 5.11 MIL/uL   Hemoglobin 10.6 (L) 12.0 - 15.0 g/dL   HCT 29.4 (L) 36.0 - 46.0 %   MCV 88.8 78.0 - 100.0 fL   MCH 32.0 26.0 - 34.0 pg   MCHC 36.1 (H) 30.0 - 36.0 g/dL   RDW 13.5 11.5 - 15.5 %   Platelets 171 150 - 400 K/uL   Neutrophils Relative % 74 %   Neutro Abs 7.7 1.7 - 7.7 K/uL   Lymphocytes Relative 21 %   Lymphs Abs 2.2 0.7 - 4.0 K/uL   Monocytes Relative 4 %   Monocytes Absolute 0.4 0.1 - 1.0 K/uL   Eosinophils Relative 1 %   Eosinophils Absolute 0.1 0.0 - 0.7 K/uL   Basophils Relative 0 %   Basophils Absolute 0.0 0.0 - 0.1 K/uL    Comment: Performed at [facility], [address] [place], [place] [address]    Treatments: IV hydration and antibiotics: Zosyn  Discharge Exam: Blood pressure (!) 102/58, pulse 92, temperature 98.5 F (36.9 C), temperature source Oral, resp. rate 16, last menstrual period 06/06/2017, SpO2 98 %. General appearance: alert, cooperative and no distress Back: no tenderness to percussion or palpation, symmetric, no curvature. ROM normal. No CVA tenderness. GI: soft, non-tender; bowel sounds normal; no masses,  no organomegaly  Disposition: Discharge disposition: 01-Home or Self Care        Allergies as of 11/10/2017   No Known Allergies     Medication List    TAKE these medications   acetaminophen 325 MG tablet Commonly known as:  TYLENOL Take 2 tablets (650 mg total) by mouth every 6 (six) hours as needed (for pain scale < 4  OR  temperature  >/=  100.5 F).   cephALEXin 500 MG capsule Commonly known as:  KEFLEX Take 1  capsule (500 mg total) by mouth 4 (four) times daily.   prenatal multivitamin Tabs tablet Take 1 tablet by mouth daily at 12 noon.        Signed: Shon Millet II 11/10/2017, 10:35 AM

## 2017-11-10 NOTE — Progress Notes (Signed)
Discharge instructions and prescriptions given to pt. Discussed signs and symptoms to report to the MD, upcoming appointments, and meds. Pt verbalizes understanding and has no questions or concerns at this time. Pt discharged from hospital in stable condition. 

## 2018-02-14 LAB — OB RESULTS CONSOLE GBS: GBS: NEGATIVE

## 2018-02-28 ENCOUNTER — Encounter (HOSPITAL_COMMUNITY): Payer: Self-pay | Admitting: *Deleted

## 2018-02-28 ENCOUNTER — Telehealth (HOSPITAL_COMMUNITY): Payer: Self-pay | Admitting: *Deleted

## 2018-02-28 NOTE — Telephone Encounter (Signed)
Preadmission screen  

## 2018-03-08 NOTE — H&P (Signed)
Adya Wirz is a 28 y.o. female presenting for IOL at term. Pregnancy complicated by probable kidney stone at 22 weeks. IUI donor sperm pregnancy. OB History    Gravida  1   Para      Term      Preterm      AB      Living        SAB      TAB      Ectopic      Multiple      Live Births             No past medical history on file. No past surgical history on file. Family History: family history includes Breast cancer in her mother; Heart disease in her mother; Rheum arthritis in her mother; Thyroid disease in her mother. Social History:  reports that she has never smoked. She has never used smokeless tobacco. She reports previous alcohol use. She reports that she does not use drugs.     Maternal Diabetes: No Genetic Screening: Normal Maternal Ultrasounds/Referrals: Normal Fetal Ultrasounds or other Referrals:  None Maternal Substance Abuse:  No Significant Maternal Medications:  None Significant Maternal Lab Results:  None Other Comments:  None  ROS History   Last menstrual period 06/06/2017. Exam Physical Exam  Lungs CTA Cor RRR Cx 1/thick/vtx  03/06/18  Prenatal labs: ABO, Rh: --/--/B POS, B POS Performed at [facility], [address] [place], [place] [address]  [phone]/27 1542) Antibody: NEG (09/27 1542) Rubella: Immune (06/20 0000) RPR: Nonreactive (06/20 0000)  HBsAg: Negative (06/20 0000)  HIV: Non-reactive (06/20 0000)  GBS:   negative 02/14/18  Assessment/Plan: 28 yo G1P0 for two stage IOL   Shon Millet II 03/08/2018, 1:36 PM

## 2018-03-11 ENCOUNTER — Inpatient Hospital Stay (HOSPITAL_COMMUNITY): Payer: BC Managed Care – PPO | Admitting: Anesthesiology

## 2018-03-11 ENCOUNTER — Encounter (HOSPITAL_COMMUNITY): Payer: Self-pay

## 2018-03-11 ENCOUNTER — Inpatient Hospital Stay (HOSPITAL_COMMUNITY): Admission: RE | Admit: 2018-03-11 | Payer: BC Managed Care – PPO | Source: Ambulatory Visit

## 2018-03-11 ENCOUNTER — Other Ambulatory Visit: Payer: Self-pay

## 2018-03-11 ENCOUNTER — Inpatient Hospital Stay (HOSPITAL_COMMUNITY)
Admission: RE | Admit: 2018-03-11 | Discharge: 2018-03-14 | DRG: 788 | Disposition: A | Discharge status: Home / Self Care | Payer: BC Managed Care – PPO | Attending: Obstetrics and Gynecology | Admitting: Obstetrics and Gynecology

## 2018-03-11 ENCOUNTER — Encounter (HOSPITAL_COMMUNITY)
Admission: RE | Disposition: A | Discharge status: Home / Self Care | Payer: Self-pay | Source: Home / Self Care | Attending: Obstetrics and Gynecology

## 2018-03-11 VITALS — BP 102/73 | HR 65 | Temp 98.0°F | Resp 16 | Ht 65.0 in | Wt 201.6 lb

## 2018-03-11 DIAGNOSIS — O3413 Maternal care for benign tumor of corpus uteri, third trimester: Secondary | ICD-10-CM | POA: Diagnosis present

## 2018-03-11 DIAGNOSIS — O2302 Infections of kidney in pregnancy, second trimester: Secondary | ICD-10-CM

## 2018-03-11 DIAGNOSIS — D252 Subserosal leiomyoma of uterus: Secondary | ICD-10-CM | POA: Diagnosis present

## 2018-03-11 DIAGNOSIS — Z3A39 39 weeks gestation of pregnancy: Secondary | ICD-10-CM

## 2018-03-11 DIAGNOSIS — O26893 Other specified pregnancy related conditions, third trimester: Secondary | ICD-10-CM | POA: Diagnosis present

## 2018-03-11 DIAGNOSIS — O324XX0 Maternal care for high head at term, not applicable or unspecified: Principal | ICD-10-CM | POA: Diagnosis present

## 2018-03-11 DIAGNOSIS — N12 Tubulo-interstitial nephritis, not specified as acute or chronic: Secondary | ICD-10-CM

## 2018-03-11 DIAGNOSIS — Z3490 Encounter for supervision of normal pregnancy, unspecified, unspecified trimester: Secondary | ICD-10-CM

## 2018-03-11 LAB — CBC
HCT: 37.6 % (ref 36.0–46.0)
Hemoglobin: 12.7 g/dL (ref 12.0–15.0)
MCH: 30 pg (ref 26.0–34.0)
MCHC: 33.8 g/dL (ref 30.0–36.0)
MCV: 88.9 fL (ref 80.0–100.0)
Platelets: 169 10*3/uL (ref 150–400)
RBC: 4.23 MIL/uL (ref 3.87–5.11)
RDW: 13.2 % (ref 11.5–15.5)
WBC: 11.2 10*3/uL — AB (ref 4.0–10.5)
nRBC: 0 % (ref 0.0–0.2)

## 2018-03-11 LAB — TYPE AND SCREEN
ABO/RH(D): B POS
ANTIBODY SCREEN: NEGATIVE

## 2018-03-11 LAB — RPR: RPR Ser Ql: NONREACTIVE

## 2018-03-11 SURGERY — Surgical Case
Anesthesia: Epidural | Site: Abdomen | Wound class: Clean Contaminated

## 2018-03-11 MED ORDER — EPHEDRINE 5 MG/ML INJ: 10.0000 mg | INTRAVENOUS | Status: DC | PRN

## 2018-03-11 MED ORDER — MORPHINE SULFATE-NACL 0.5-0.9 MG/ML-% IV SOSY
PREFILLED_SYRINGE | INTRAVENOUS | Status: DC | PRN
  Administered 2018-03-11: 3 mg via EPIDURAL

## 2018-03-11 MED ORDER — BUTORPHANOL TARTRATE 1 MG/ML IJ SOLN: 1.0000 mg | INTRAMUSCULAR | Status: DC | PRN

## 2018-03-11 MED ORDER — METOCLOPRAMIDE HCL 5 MG/ML IJ SOLN
INTRAMUSCULAR | Status: AC
  Filled 2018-03-11: qty 2

## 2018-03-11 MED ORDER — OXYTOCIN BOLUS FROM INFUSION: 500.0000 mL | Freq: Once | INTRAVENOUS | Status: DC

## 2018-03-11 MED ORDER — DIPHENHYDRAMINE HCL 50 MG/ML IJ SOLN: 12.5000 mg | INTRAMUSCULAR | Status: DC | PRN

## 2018-03-11 MED ORDER — FENTANYL CITRATE (PF) 100 MCG/2ML IJ SOLN: 25.0000 ug | INTRAMUSCULAR | Status: DC | PRN

## 2018-03-11 MED ORDER — MORPHINE SULFATE (PF) 0.5 MG/ML IJ SOLN
INTRAMUSCULAR | Status: AC
  Filled 2018-03-11: qty 10

## 2018-03-11 MED ORDER — NALOXONE HCL 0.4 MG/ML IJ SOLN: 0.4000 mg | INTRAMUSCULAR | Status: DC | PRN

## 2018-03-11 MED ORDER — OXYTOCIN 10 UNIT/ML IJ SOLN
INTRAMUSCULAR | Status: AC
  Filled 2018-03-11: qty 4

## 2018-03-11 MED ORDER — SODIUM CHLORIDE 0.9 % IR SOLN
Status: DC | PRN
  Administered 2018-03-11: 1

## 2018-03-11 MED ORDER — LACTATED RINGERS IV SOLN: 500.0000 mL | Freq: Once | INTRAVENOUS | Status: DC

## 2018-03-11 MED ORDER — SOD CITRATE-CITRIC ACID 500-334 MG/5ML PO SOLN
30.0000 mL | ORAL | Status: DC | PRN
  Administered 2018-03-11: 30 mL via ORAL
  Filled 2018-03-11: qty 15

## 2018-03-11 MED ORDER — PHENYLEPHRINE 40 MCG/ML (10ML) SYRINGE FOR IV PUSH (FOR BLOOD PRESSURE SUPPORT)
PREFILLED_SYRINGE | INTRAVENOUS | Status: AC
  Filled 2018-03-11: qty 10

## 2018-03-11 MED ORDER — SODIUM CHLORIDE 0.9% FLUSH: 3.0000 mL | INTRAVENOUS | Status: DC | PRN

## 2018-03-11 MED ORDER — LIDOCAINE-EPINEPHRINE (PF) 2 %-1:200000 IJ SOLN
INTRAMUSCULAR | Status: AC
  Filled 2018-03-11: qty 20

## 2018-03-11 MED ORDER — LACTATED RINGERS IV SOLN: INTRAVENOUS | Status: DC

## 2018-03-11 MED ORDER — ACETAMINOPHEN 500 MG PO TABS
1000.0000 mg | ORAL_TABLET | Freq: Four times a day (QID) | ORAL | Status: AC
  Administered 2018-03-12 (×3): 1000 mg via ORAL
  Filled 2018-03-11 (×3): qty 2

## 2018-03-11 MED ORDER — CEFAZOLIN SODIUM-DEXTROSE 2-3 GM-%(50ML) IV SOLR
INTRAVENOUS | Status: DC | PRN
  Administered 2018-03-11: 2 g via INTRAVENOUS

## 2018-03-11 MED ORDER — DEXAMETHASONE SODIUM PHOSPHATE 4 MG/ML IJ SOLN
INTRAMUSCULAR | Status: DC | PRN
  Administered 2018-03-11: 4 mg via INTRAVENOUS

## 2018-03-11 MED ORDER — ONDANSETRON HCL 4 MG/2ML IJ SOLN: 4.0000 mg | Freq: Three times a day (TID) | INTRAMUSCULAR | Status: DC | PRN

## 2018-03-11 MED ORDER — TERBUTALINE SULFATE 1 MG/ML IJ SOLN: 0.2500 mg | Freq: Once | INTRAMUSCULAR | Status: DC | PRN

## 2018-03-11 MED ORDER — ACETAMINOPHEN 325 MG PO TABS: 650.0000 mg | ORAL_TABLET | ORAL | Status: DC | PRN

## 2018-03-11 MED ORDER — ONDANSETRON HCL 4 MG/2ML IJ SOLN
INTRAMUSCULAR | Status: AC
  Filled 2018-03-11: qty 2

## 2018-03-11 MED ORDER — OXYCODONE-ACETAMINOPHEN 5-325 MG PO TABS: 2.0000 | ORAL_TABLET | ORAL | Status: DC | PRN

## 2018-03-11 MED ORDER — METOCLOPRAMIDE HCL 5 MG/ML IJ SOLN
INTRAMUSCULAR | Status: DC | PRN
  Administered 2018-03-11 (×2): 5 mg via INTRAVENOUS

## 2018-03-11 MED ORDER — LACTATED RINGERS IV SOLN
INTRAVENOUS | Status: DC
  Administered 2018-03-11 (×6): via INTRAVENOUS

## 2018-03-11 MED ORDER — OXYTOCIN 10 UNIT/ML IJ SOLN
INTRAVENOUS | Status: DC | PRN
  Administered 2018-03-11: 40 [IU] via INTRAVENOUS

## 2018-03-11 MED ORDER — MEPERIDINE HCL 25 MG/ML IJ SOLN: 6.2500 mg | INTRAMUSCULAR | Status: DC | PRN

## 2018-03-11 MED ORDER — PHENYLEPHRINE 40 MCG/ML (10ML) SYRINGE FOR IV PUSH (FOR BLOOD PRESSURE SUPPORT)
80.0000 ug | PREFILLED_SYRINGE | INTRAVENOUS | Status: DC | PRN
  Filled 2018-03-11: qty 10

## 2018-03-11 MED ORDER — LACTATED RINGERS IV SOLN: 500.0000 mL | INTRAVENOUS | Status: DC | PRN

## 2018-03-11 MED ORDER — KETOROLAC TROMETHAMINE 30 MG/ML IJ SOLN
30.0000 mg | Freq: Four times a day (QID) | INTRAMUSCULAR | Status: AC | PRN
  Administered 2018-03-11: 30 mg via INTRAVENOUS

## 2018-03-11 MED ORDER — METOCLOPRAMIDE HCL 5 MG/ML IJ SOLN: 10.0000 mg | Freq: Once | INTRAMUSCULAR | Status: DC | PRN

## 2018-03-11 MED ORDER — OXYTOCIN 40 UNITS IN NORMAL SALINE INFUSION - SIMPLE MED: 2.5000 [IU]/h | INTRAVENOUS | Status: DC

## 2018-03-11 MED ORDER — PHENYLEPHRINE 40 MCG/ML (10ML) SYRINGE FOR IV PUSH (FOR BLOOD PRESSURE SUPPORT)
PREFILLED_SYRINGE | INTRAVENOUS | Status: DC | PRN
  Administered 2018-03-11 (×3): 80 ug via INTRAVENOUS

## 2018-03-11 MED ORDER — KETOROLAC TROMETHAMINE 30 MG/ML IJ SOLN
INTRAMUSCULAR | Status: AC
  Administered 2018-03-11: 30 mg via INTRAVENOUS
  Filled 2018-03-11: qty 1

## 2018-03-11 MED ORDER — OXYCODONE-ACETAMINOPHEN 5-325 MG PO TABS: 1.0000 | ORAL_TABLET | ORAL | Status: DC | PRN

## 2018-03-11 MED ORDER — KETOROLAC TROMETHAMINE 30 MG/ML IJ SOLN: 30.0000 mg | Freq: Four times a day (QID) | INTRAMUSCULAR | Status: AC | PRN

## 2018-03-11 MED ORDER — FENTANYL 2.5 MCG/ML BUPIVACAINE 1/10 % EPIDURAL INFUSION (WH - ANES)
14.0000 mL/h | INTRAMUSCULAR | Status: DC | PRN
  Administered 2018-03-11: 14 mL/h via EPIDURAL
  Filled 2018-03-11: qty 100

## 2018-03-11 MED ORDER — OXYTOCIN 40 UNITS IN NORMAL SALINE INFUSION - SIMPLE MED
1.0000 m[IU]/min | INTRAVENOUS | Status: DC
  Administered 2018-03-11: 2 m[IU]/min via INTRAVENOUS
  Administered 2018-03-11: 16 m[IU]/min via INTRAVENOUS
  Administered 2018-03-11: 18 m[IU]/min via INTRAVENOUS
  Administered 2018-03-11: 14 m[IU]/min via INTRAVENOUS
  Filled 2018-03-11: qty 1000

## 2018-03-11 MED ORDER — MISOPROSTOL 25 MCG QUARTER TABLET
25.0000 ug | ORAL_TABLET | ORAL | Status: DC | PRN
  Administered 2018-03-11 (×3): 25 ug via VAGINAL
  Filled 2018-03-11 (×3): qty 1

## 2018-03-11 MED ORDER — ONDANSETRON HCL 4 MG/2ML IJ SOLN
INTRAMUSCULAR | Status: DC | PRN
  Administered 2018-03-11: 4 mg via INTRAVENOUS

## 2018-03-11 MED ORDER — ONDANSETRON HCL 4 MG/2ML IJ SOLN: 4.0000 mg | Freq: Four times a day (QID) | INTRAMUSCULAR | Status: DC | PRN

## 2018-03-11 MED ORDER — CEFAZOLIN SODIUM-DEXTROSE 2-4 GM/100ML-% IV SOLN
INTRAVENOUS | Status: AC
  Filled 2018-03-11: qty 100

## 2018-03-11 MED ORDER — FLEET ENEMA 7-19 GM/118ML RE ENEM: 1.0000 | ENEMA | RECTAL | Status: DC | PRN

## 2018-03-11 MED ORDER — SODIUM BICARBONATE 8.4 % IV SOLN
INTRAVENOUS | Status: AC
  Filled 2018-03-11: qty 50

## 2018-03-11 MED ORDER — SODIUM BICARBONATE 8.4 % IV SOLN
INTRAVENOUS | Status: DC | PRN
  Administered 2018-03-11: 10 mL via EPIDURAL
  Administered 2018-03-11: 5 mL via EPIDURAL

## 2018-03-11 MED ORDER — LIDOCAINE HCL (PF) 1 % IJ SOLN
INTRAMUSCULAR | Status: DC | PRN
  Administered 2018-03-11: 13 mL via EPIDURAL

## 2018-03-11 MED ORDER — LIDOCAINE HCL (PF) 1 % IJ SOLN: 30.0000 mL | INTRAMUSCULAR | Status: DC | PRN

## 2018-03-11 SURGICAL SUPPLY — 34 items
BENZOIN TINCTURE PRP APPL 2/3 (GAUZE/BANDAGES/DRESSINGS) ×3 IMPLANT
CHLORAPREP W/TINT 26ML (MISCELLANEOUS) ×3 IMPLANT
CLAMP CORD UMBIL (MISCELLANEOUS) IMPLANT
CLOSURE WOUND 1/2 X4 (GAUZE/BANDAGES/DRESSINGS) ×1
CLOTH BEACON ORANGE TIMEOUT ST (SAFETY) ×3 IMPLANT
DERMABOND ADVANCED (GAUZE/BANDAGES/DRESSINGS)
DERMABOND ADVANCED .7 DNX12 (GAUZE/BANDAGES/DRESSINGS) IMPLANT
DRSG OPSITE POSTOP 4X10 (GAUZE/BANDAGES/DRESSINGS) ×3 IMPLANT
ELECT REM PT RETURN 9FT ADLT (ELECTROSURGICAL) ×3
ELECTRODE REM PT RTRN 9FT ADLT (ELECTROSURGICAL) ×1 IMPLANT
EXTRACTOR VACUUM M CUP 4 TUBE (SUCTIONS) IMPLANT
EXTRACTOR VACUUM M CUP 4' TUBE (SUCTIONS)
GLOVE BIO SURGEON STRL SZ7.5 (GLOVE) ×3 IMPLANT
GLOVE BIOGEL PI IND STRL 7.0 (GLOVE) ×1 IMPLANT
GLOVE BIOGEL PI INDICATOR 7.0 (GLOVE) ×2
GOWN STRL REUS W/TWL LRG LVL3 (GOWN DISPOSABLE) ×6 IMPLANT
KIT ABG SYR 3ML LUER SLIP (SYRINGE) ×3 IMPLANT
NEEDLE HYPO 25X5/8 SAFETYGLIDE (NEEDLE) ×3 IMPLANT
NS IRRIG 1000ML POUR BTL (IV SOLUTION) ×3 IMPLANT
PACK C SECTION WH (CUSTOM PROCEDURE TRAY) ×3 IMPLANT
PAD ABD 8X10 STRL (GAUZE/BANDAGES/DRESSINGS) ×3 IMPLANT
PAD OB MATERNITY 4.3X12.25 (PERSONAL CARE ITEMS) ×3 IMPLANT
PENCIL SMOKE EVAC W/HOLSTER (ELECTROSURGICAL) ×3 IMPLANT
STRIP CLOSURE SKIN 1/2X4 (GAUZE/BANDAGES/DRESSINGS) ×2 IMPLANT
SUT MNCRL 0 VIOLET CTX 36 (SUTURE) ×4 IMPLANT
SUT MONOCRYL 0 CTX 36 (SUTURE) ×8
SUT PDS AB 0 CTX 60 (SUTURE) ×3 IMPLANT
SUT PLAIN 0 NONE (SUTURE) IMPLANT
SUT PLAIN 2 0 (SUTURE)
SUT PLAIN 2 0 XLH (SUTURE) IMPLANT
SUT PLAIN ABS 2-0 CT1 27XMFL (SUTURE) IMPLANT
SUT VIC AB 4-0 KS 27 (SUTURE) ×3 IMPLANT
TOWEL OR 17X24 6PK STRL BLUE (TOWEL DISPOSABLE) ×3 IMPLANT
TRAY FOLEY W/BAG SLVR 14FR LF (SET/KITS/TRAYS/PACK) ×3 IMPLANT

## 2018-03-11 NOTE — Progress Notes (Signed)
FHT cat one Pitocin on Cx 3-4/80/-2 IUPC placed

## 2018-03-11 NOTE — Anesthesia Pain Management Evaluation Note (Signed)
  CRNA Pain Management Visit Note  Patient: Donna Cameron, 28 y.o., female  "Hello I am a member of the anesthesia team at [facility]. We have an anesthesia team available at all times to provide care throughout the hospital, including epidural management and anesthesia for C-section. I don't know your plan for the delivery whether it a natural birth, water birth, IV sedation, nitrous supplementation, doula or epidural, but we want to meet your pain goals."   1.Was your pain managed to your expectations on prior hospitalizations?   No prior hospitalizations  2.What is your expectation for pain management during this hospitalization?     Epidural  3.How can we help you reach that goal? Epidural when desired  Record the patient's initial score and the patient's pain goal.   Pain: 0  Pain Goal: 5 [facility] wants you to be able to say your pain was always managed very well.  [name] 03/11/2018

## 2018-03-11 NOTE — Anesthesia Preprocedure Evaluation (Signed)

## 2018-03-11 NOTE — Brief Op Note (Signed)
03/11/2018  10:50 PM  PATIENT:  Donna Cameron  28 y.o. female  PRE-OPERATIVE DIAGNOSIS:  Arrest of Descent  POST-OPERATIVE DIAGNOSIS:  Arrest of Descent  PROCEDURE:  Procedure(s): CESAREAN SECTION (N/A)  SURGEON:  Surgeon(s) and Role:    * Everlene Farrier, MD - Primary  PHYSICIAN ASSISTANT:   ASSISTANTS: none   ANESTHESIA:   epidural  EBL:  133 mL   BLOOD ADMINISTERED:none  DRAINS: Urinary Catheter (Foley)   LOCAL MEDICATIONS USED:  NONE  SPECIMEN:  No Specimen  DISPOSITION OF SPECIMEN:  N/A  COUNTS:  YES  TOURNIQUET:  * No tourniquets in log *  DICTATION: .Other Dictation: Dictation Number [id number]  PLAN OF CARE: Admit to inpatient   PATIENT DISPOSITION:  PACU - hemodynamically stable.   Delay start of Pharmacological VTE agent (>24hrs) due to surgical blood loss or risk of bleeding: not applicable

## 2018-03-11 NOTE — Progress Notes (Signed)
FHT cat one, good response to scalp stim with check Cx 2/50/-2/vtx/posterior UC q2-4 mild/mod D/W patient>Cytotec 48mcg placed in vagina, will check in 4 hours

## 2018-03-11 NOTE — Progress Notes (Signed)
FHT cat one Cx no change  A/P: arrest of dilation Rec: C/S. D/W patient procedure and risks including infection, organ damage, bleeding/transfusion-HIV/Hep, DVT/PE, pneumonia, return to OR and wound breakdown. Patient states she understands and agrees.

## 2018-03-11 NOTE — Anesthesia Procedure Notes (Signed)
Epidural Patient location during procedure: OB Start time: 03/11/2018 2:22 PM End time: 03/11/2018 2:32 PM  Staffing Anesthesiologist: Lynda Rainwater, MD Performed: anesthesiologist   Preanesthetic Checklist Completed: patient identified, site marked, surgical consent, pre-op evaluation, timeout performed, IV checked, risks and benefits discussed and monitors and equipment checked  Epidural Patient position: sitting Prep: ChloraPrep Patient monitoring: heart rate, cardiac monitor, continuous pulse ox and blood pressure Approach: midline Location: L2-L3 Injection technique: LOR saline  Needle:  Needle type: Tuohy  Needle gauge: 17 G Needle length: 9 cm Needle insertion depth: 5 cm Catheter type: closed end flexible Catheter size: 20 Guage Catheter at skin depth: 9 cm Test dose: negative  Assessment Events: blood not aspirated, injection not painful, no injection resistance, negative IV test and no paresthesia  Additional Notes Reason for block:procedure for pain

## 2018-03-11 NOTE — Transfer of Care (Signed)
Immediate Anesthesia Transfer of Care Note  Patient: Donna Cameron  Procedure(s) Performed: CESAREAN SECTION (N/A )  Patient Location: PACU  Anesthesia Type:Epidural  Level of Consciousness: awake, alert  and oriented  Airway & Oxygen Therapy: Patient Spontanous Breathing  Post-op Assessment: Report given to RN and Post -op Vital signs reviewed and stable  Post vital signs: Reviewed and stable HR 100, RR 20, SaO2 98%, BP 101/43  Last Vitals:  Vitals Value Taken Time  BP 101/43 03/11/2018 11:01 PM  Temp    Pulse 100 03/11/2018 11:07 PM  Resp 22 03/11/2018 11:07 PM  SpO2 96 % 03/11/2018 11:07 PM  Vitals shown include unvalidated device data.  Last Pain:  Vitals:   03/11/18 1931  TempSrc: Oral  PainSc: 0-No pain         Complications: No apparent anesthesia complications

## 2018-03-11 NOTE — Progress Notes (Signed)
Cx no change FHT + accel, good response to scalp stim UCs MVU 190-200

## 2018-03-11 NOTE — Progress Notes (Signed)
FHT cat one Cx 2-3/70/-2 AROM clear A/P: D/W patient>pitocin in 1 hour if needed

## 2018-03-11 NOTE — Progress Notes (Signed)
No change to H&P per patient history Today's Vitals   03/11/18 0508 03/11/18 0546 03/11/18 0659 03/11/18 0732  BP: 105/62 118/79 122/73   Pulse: 89 92 84   Resp: 19 19 18    Temp: 98 F (36.7 C)     TempSrc: Oral     SpO2:      Weight:      Height:      PainSc:    0-No pain   Body mass index is 33.55 kg/m.   FHT cat one UCs q3-4 mild  A/P: will check about 9 am

## 2018-03-12 ENCOUNTER — Encounter (HOSPITAL_COMMUNITY): Payer: Self-pay

## 2018-03-12 LAB — CBC
HEMATOCRIT: 33.8 % — AB (ref 36.0–46.0)
Hemoglobin: 11.5 g/dL — ABNORMAL LOW (ref 12.0–15.0)
MCH: 29.9 pg (ref 26.0–34.0)
MCHC: 34 g/dL (ref 30.0–36.0)
MCV: 88 fL (ref 80.0–100.0)
Platelets: 136 10*3/uL — ABNORMAL LOW (ref 150–400)
RBC: 3.84 MIL/uL — ABNORMAL LOW (ref 3.87–5.11)
RDW: 13.1 % (ref 11.5–15.5)
WBC: 16.1 10*3/uL — AB (ref 4.0–10.5)
nRBC: 0 % (ref 0.0–0.2)

## 2018-03-12 MED ORDER — TETANUS-DIPHTH-ACELL PERTUSSIS 5-2.5-18.5 LF-MCG/0.5 IM SUSP: 0.5000 mL | Freq: Once | INTRAMUSCULAR | Status: DC

## 2018-03-12 MED ORDER — SENNOSIDES-DOCUSATE SODIUM 8.6-50 MG PO TABS: 2.0000 | ORAL_TABLET | ORAL | Status: DC

## 2018-03-12 MED ORDER — DIPHENHYDRAMINE HCL 25 MG PO CAPS
25.0000 mg | ORAL_CAPSULE | Freq: Four times a day (QID) | ORAL | Status: DC | PRN
  Administered 2018-03-12: 25 mg via ORAL
  Filled 2018-03-12: qty 1

## 2018-03-12 MED ORDER — OXYCODONE HCL 5 MG PO TABS
5.0000 mg | ORAL_TABLET | ORAL | Status: DC | PRN
  Administered 2018-03-12: 5 mg via ORAL
  Administered 2018-03-13 (×4): 10 mg via ORAL
  Administered 2018-03-13 – 2018-03-14 (×2): 5 mg via ORAL
  Filled 2018-03-12: qty 2
  Filled 2018-03-12 (×3): qty 1
  Filled 2018-03-12 (×3): qty 2
  Filled 2018-03-12: qty 1

## 2018-03-12 MED ORDER — SENNOSIDES-DOCUSATE SODIUM 8.6-50 MG PO TABS
2.0000 | ORAL_TABLET | ORAL | Status: DC
  Administered 2018-03-12 – 2018-03-13 (×2): 2 via ORAL
  Filled 2018-03-12 (×2): qty 2

## 2018-03-12 MED ORDER — SIMETHICONE 80 MG PO CHEW: 80.0000 mg | CHEWABLE_TABLET | ORAL | Status: DC

## 2018-03-12 MED ORDER — DIBUCAINE 1 % RE OINT: 1.0000 "application " | TOPICAL_OINTMENT | RECTAL | Status: DC | PRN

## 2018-03-12 MED ORDER — WITCH HAZEL-GLYCERIN EX PADS: 1.0000 "application " | MEDICATED_PAD | CUTANEOUS | Status: DC | PRN

## 2018-03-12 MED ORDER — ACETAMINOPHEN 325 MG PO TABS
650.0000 mg | ORAL_TABLET | ORAL | Status: DC | PRN
  Administered 2018-03-13 – 2018-03-14 (×4): 650 mg via ORAL
  Filled 2018-03-12 (×5): qty 2

## 2018-03-12 MED ORDER — ZOLPIDEM TARTRATE 5 MG PO TABS: 5.0000 mg | ORAL_TABLET | Freq: Every evening | ORAL | Status: DC | PRN

## 2018-03-12 MED ORDER — MENTHOL 3 MG MT LOZG: 1.0000 | LOZENGE | OROMUCOSAL | Status: DC | PRN

## 2018-03-12 MED ORDER — COCONUT OIL OIL: 1.0000 "application " | TOPICAL_OIL | Status: DC | PRN

## 2018-03-12 MED ORDER — PRENATAL MULTIVITAMIN CH
1.0000 | ORAL_TABLET | Freq: Every day | ORAL | Status: DC
  Administered 2018-03-12 – 2018-03-13 (×2): 1 via ORAL
  Filled 2018-03-12 (×2): qty 1

## 2018-03-12 MED ORDER — OXYTOCIN 40 UNITS IN NORMAL SALINE INFUSION - SIMPLE MED: 2.5000 [IU]/h | INTRAVENOUS | Status: AC

## 2018-03-12 MED ORDER — SIMETHICONE 80 MG PO CHEW
80.0000 mg | CHEWABLE_TABLET | Freq: Three times a day (TID) | ORAL | Status: DC
  Administered 2018-03-12 – 2018-03-14 (×7): 80 mg via ORAL
  Filled 2018-03-12 (×7): qty 1

## 2018-03-12 MED ORDER — SIMETHICONE 80 MG PO CHEW
80.0000 mg | CHEWABLE_TABLET | ORAL | Status: DC
  Administered 2018-03-12 – 2018-03-13 (×2): 80 mg via ORAL
  Filled 2018-03-12 (×2): qty 1

## 2018-03-12 MED ORDER — LACTATED RINGERS IV SOLN
INTRAVENOUS | Status: DC
  Administered 2018-03-12: 08:00:00 via INTRAVENOUS

## 2018-03-12 MED ORDER — SIMETHICONE 80 MG PO CHEW: 80.0000 mg | CHEWABLE_TABLET | ORAL | Status: DC | PRN

## 2018-03-12 NOTE — Op Note (Signed)
Donna Cameron, Donna Cameron MEDICAL RECORD [id number] ACCOUNT [id number] DATE OF BIRTH:[birth year] FACILITY: [facility] LOCATION: [place] II, MD  OPERATIVE REPORT  DATE OF PROCEDURE:  03/11/2018  PREOPERATIVE DIAGNOSIS:  Arrest of descent.  POSTOPERATIVE DIAGNOSIS:  Arrest of descent.  PROCEDURE:  Primary low transverse cesarean section.  SURGEON:  Everlene Farrier, MD  ANESTHESIA:  Epidural.  ESTIMATED BLOOD LOSS:  133 mL  SPECIMENS:  None.  FINDINGS:  Viable female infant.  Apgars and arterial cord pH pending.  Birth weight pending.  INDICATIONS AND CONSENT:  This patient is a 28 year old G1, P0 at 39-5/7 weeks who was admitted for two stage induction of labor.  She progressed to 4 cm dilation, 90% effaced and -3 station.  Despite good labor, there is no change.  Recommendation for  cesarean section was made.  Potential risks and complications are discussed preoperatively including but not limited to infection, organ damage, bleeding requiring transfusion of blood products with HIV and hepatitis acquisition, DVT, PE, pneumonia,  return to the operating room, revision of the incision.  The patient states she understands and agrees and consent was signed on the chart.  DESCRIPTION OF PROCEDURE:  The patient was taken to the operating room where she was identified and her epidural anesthetic is augmented to surgical level.  She was placed in the dorsal supine position with a 15 degree left lateral wedge.  She was  prepped vaginally with Betadine abdominally with ChloraPrep.  Foley catheter was already in place.  After 3 minute drying time, she was draped in a sterile fashion.  Timeout was undertaken.  After testing for adequate epidural anesthesia, skin was  entered through a Pfannenstiel incision and dissection was carried out in layers to the peritoneum.  Peritoneum was taken down superiorly and inferiorly.  Vesicouterine peritoneum was taken down cephalad laterally.   Bladder flap developed.  The bladder  blade was placed.  Uterus was incised in a low transverse manner and the uterine cavity was entered bluntly with a hemostat.  The uterine incision was extended with the fingers.  Vertex was then delivered without difficulty.  Baby was delivered.  Good  cry and tone was noted.  After 1 minute, the cord was clamped and cut and the baby was handed to waiting pediatrics team.  Placenta is manually extracted.  Uterine cavity is clean.  Uterus was closed in 2 running locking imbricating layers of 0 Monocryl  suture, which achieved good hemostasis.  Lavage was carried out.  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  The anterior peritoneum was closed in a running fashion with 0  Monocryl suture, which was used to reapproximate the pyramidalis muscle in the midline.  The anterior rectus fascia was closed in a running fashion with a 0 looped PDS.  Skin was closed with interrupted plain and the incision was closed in a subcuticular  fashion with 4-0 Vicryl on a Keith needle.  Benzoin and Steri-Strips were applied.  A honeycomb dressing and pressure dressing are applied.  All counts were correct.  The patient was taken to recovery room in stable condition.  TN/NUANCE  D:03/11/2018 T:03/12/2018 JOB:[id number]

## 2018-03-12 NOTE — Anesthesia Postprocedure Evaluation (Signed)
Anesthesia Post Note  Patient: Donna Cameron  Procedure(s) Performed: CESAREAN SECTION (N/A Abdomen)     Patient location during evaluation: Mother Baby Anesthesia Type: Epidural Level of consciousness: awake Pain management: pain level controlled Vital Signs Assessment: post-procedure vital signs reviewed and stable Respiratory status: spontaneous breathing Cardiovascular status: stable Postop Assessment: epidural receding and patient able to bend at knees Anesthetic complications: no    Last Vitals:  Vitals:   03/12/18 0330 03/12/18 0730  BP: 101/73 (!) 106/53  Pulse: 70 70  Resp:  16  Temp: 37.1 C 37.2 C  SpO2: 95% 94%    Last Pain:  Vitals:   03/12/18 0730  TempSrc: Oral  PainSc:    Pain Goal:                   Everette Rank

## 2018-03-12 NOTE — Anesthesia Postprocedure Evaluation (Signed)
Anesthesia Post Note  Patient: Donna Cameron  Procedure(s) Performed: CESAREAN SECTION (N/A Abdomen)     Patient location during evaluation: Mother Baby Anesthesia Type: Epidural Level of consciousness: awake Pain management: pain level controlled Vital Signs Assessment: post-procedure vital signs reviewed and stable Respiratory status: spontaneous breathing Cardiovascular status: stable Postop Assessment: patient able to bend at knees, epidural receding and no headache Anesthetic complications: no    Last Vitals:  Vitals:   03/12/18 0330 03/12/18 0730  BP: 101/73 (!) 106/53  Pulse: 70 70  Resp:  16  Temp: 37.1 C 37.2 C  SpO2: 95% 94%    Last Pain:  Vitals:   03/12/18 0730  TempSrc: Oral  PainSc:    Pain Goal:                   Everette Rank

## 2018-03-12 NOTE — Progress Notes (Signed)
Subjective: Postpartum Day 1 Cesarean Delivery Patient reports incisional pain and tolerating PO.    Objective: Vital signs in last 24 hours: Temp:  [97.8 F (36.6 C)-98.9 F (37.2 C)] 98.9 F (37.2 C) (01/28 0730) Pulse Rate:  [70-136] 70 (01/28 0730) Resp:  [11-28] 16 (01/28 0730) BP: (90-126)/(32-81) 106/53 (01/28 0730) SpO2:  [94 %-98 %] 94 % (01/28 0730)  Physical Exam:  General: alert and cooperative Lochia: appropriate Uterine Fundus: firm Incision: healing well, no significant drainage DVT Evaluation: No evidence of DVT seen on physical exam.  Recent Labs    03/11/18 0045 03/12/18 0516  HGB 12.7 11.5*  HCT 37.6 33.8*    Assessment/Plan: Status post Cesarean section. Doing well postoperatively.  Continue current care.  Marylynn Pearson 03/12/2018, 8:19 AM

## 2018-03-12 NOTE — Lactation Note (Signed)
This note was copied from a baby's chart. Lactation Consultation Note  Patient Name: Donna Cameron Today's Date: 03/12/2018 Reason for consult: Term Breastfeeding consultation services and support information given and reviewed.  Baby is 58 hours old and has had attempts at breast with a nipple shield.  Mom has flat nipples and semi compressible breasts.  Hand expression done with no colostrum seen.  Baby was acting very hungry a few hours ago and given a small amount of formula.  Assisted with positioning baby in football hold.  20 mm nipple shield applied.  Baby sleepy at the breast and sucked off and on for 5 minutes.  No colostrum seen in shield.  Symphony pump set up and initiated.  Instructed to attempt breast with feeding cues using nipple shield, post pump every 3 hours x 15 minutes and give any expressed milk to baby.  Supplement with formula if baby continues to show feeding cues.  Encouraged to call for assist prn.  Maternal Data Has patient been taught Hand Expression?: Yes Does the patient have breastfeeding experience prior to this delivery?: No  Feeding Feeding Type: Breast Fed  LATCH Score Latch: Repeated attempts needed to sustain latch, nipple held in mouth throughout feeding, stimulation needed to elicit sucking reflex.  Audible Swallowing: None  Type of Nipple: Flat  Comfort (Breast/Nipple): Soft / non-tender  Hold (Positioning): Assistance needed to correctly position infant at breast and maintain latch.  LATCH Score: 5  Interventions Interventions: Assisted with latch;Breast compression;Skin to skin;Adjust position;Breast massage;Support pillows;Hand express;Position options;DEBP  Lactation Tools Discussed/Used Tools: Nipple Shields Nipple shield size: 20 Pump Review: Setup, frequency, and cleaning;Milk Storage Initiated by:: LM Date initiated:: 03/12/18   Consult Status Consult Status: Follow-up Date: 03/13/18 Follow-up type:  In-patient    Ave Filter 03/12/2018, 10:23 AM

## 2018-03-13 ENCOUNTER — Encounter (HOSPITAL_COMMUNITY): Payer: Self-pay | Admitting: Obstetrics and Gynecology

## 2018-03-13 NOTE — Progress Notes (Signed)
Subjective: Postpartum Day 2: Cesarean Delivery Patient reports incisional pain, tolerating PO, + flatus and no problems voiding.    Objective: Vital signs in last 24 hours: Temp:  [97.8 F (36.6 C)-99 F (37.2 C)] 98.2 F (36.8 C) (01/29 0620) Pulse Rate:  [81-90] 84 (01/29 0620) Resp:  [16-18] 17 (01/29 0620) BP: (98-123)/(55-71) 102/65 (01/29 0620) SpO2:  [96 %-100 %] 100 % (01/29 8657)  Physical Exam:  General: alert, cooperative, appears stated age and mild distress Lochia: appropriate Uterine Fundus: firm Incision: healing well DVT Evaluation: No evidence of DVT seen on physical exam.  Recent Labs    03/11/18 0045 03/12/18 0516  HGB 12.7 11.5*  HCT 37.6 33.8*    Assessment/Plan: Status post Cesarean section. Doing well postoperatively.  Continue current care.  Luz Lex 03/13/2018, 9:33 AM

## 2018-03-13 NOTE — Lactation Note (Addendum)
This note was copied from a baby's chart. Lactation Consultation Note  Patient Name: Donna Cameron XOVAN'V Date: 03/13/2018   Baby 79 hours old.  Mother has mostly been giving formula. Encouraged breast stimulation if she wants to provide baby with breastmilk. Mother is pumping now q 2 hours and starting to get drops. Family inquired about pump quality.  Suggest purchasing a quality DEBP. Reviewed engorgement and monitoring voids/stools. Recommend hands on pumping.     Maternal Data    Feeding Feeding Type: Formula  LATCH Score                   Interventions    Lactation Tools Discussed/Used     Consult Status      Carlye Grippe 03/13/2018, 12:07 PM

## 2018-03-14 MED ORDER — OXYCODONE HCL 5 MG PO TABS: 5.0000 mg | ORAL_TABLET | ORAL | 0 refills | Status: AC | PRN

## 2018-03-14 NOTE — Discharge Summary (Signed)
Obstetric Discharge Summary Reason for Admission: induction of labor Prenatal Procedures: none Intrapartum Procedures: cesarean: low cervical, transverse Postpartum Procedures: none Complications-Operative and Postpartum: none Hemoglobin  Date Value Ref Range Status  03/12/2018 11.5 (L) 12.0 - 15.0 g/dL Final   HCT  Date Value Ref Range Status  03/12/2018 33.8 (L) 36.0 - 46.0 % Final    Physical Exam:  General: alert, cooperative and appears stated age 28: appropriate Uterine Fundus: firm Incision: healing well, no significant drainage, no dehiscence, no significant erythema DVT Evaluation: No evidence of DVT seen on physical exam.  Discharge Diagnoses: Term Pregnancy-delivered  Discharge Information: Date: 03/14/2018 Activity: pelvic rest Diet: routine Medications: Vicodin Condition: stable Instructions: refer to practice specific booklet Discharge to: home   Newborn Data: Live born female  Birth Weight: 7 lb 4.6 oz (3305 g) APGAR: 61, 9  Newborn Delivery   Birth date/time:  03/11/2020 22:20:00 Delivery type:  C-Section, Low Transverse Trial of labor:  Yes C-section categorization:  Primary     Home with mother.  Cyril Mourning 03/14/2018, 8:02 AM

## 2018-03-14 NOTE — Lactation Note (Signed)
This note was copied from a baby's chart. Lactation Consultation Note  Patient Name: Donna Cameron Date: 03/14/2018 Reason for consult: Follow-up assessment Baby is 58 hours old and at a 1% weight loss.  Mom is pumping every 2 hours and obtaining small amounts of colostrum.  Baby receiving formula supplementation.  Instructed to continue pumping 8-12 times/24 hours.  Recommended lactation outpatient appointment after milk comes to volume.  Maternal Data    Feeding Feeding Type: Bottle Fed - Formula Nipple Type: Slow - flow  LATCH Score                   Interventions    Lactation Tools Discussed/Used     Consult Status Consult Status: Complete Follow-up type: Call as needed    Ave Filter 03/14/2018, 10:13 AM

## 2018-05-03 ENCOUNTER — Encounter (HOSPITAL_COMMUNITY): Payer: Self-pay | Admitting: Obstetrics and Gynecology

## 2019-12-29 IMAGING — US US OB COMP LESS 14 WK
1 series · 14 of 28 positions shown · non-contrast
Comparison: None.

CLINICAL DATA: Right pelvic pain.

EXAM:
OBSTETRIC <14 WK US AND TRANSVAGINAL OB US
TECHNIQUE: Both transabdominal and transvaginal ultrasound examinations were
performed for complete evaluation of the gestation as well as the
maternal uterus, adnexal regions, and pelvic cul-de-sac.
Transvaginal technique was performed to assess early pregnancy.

[Series 1: us ob comp less 14 wk · 0.26mm/px · 64 acquisitions, 14 frames shown]
[im 3/64]
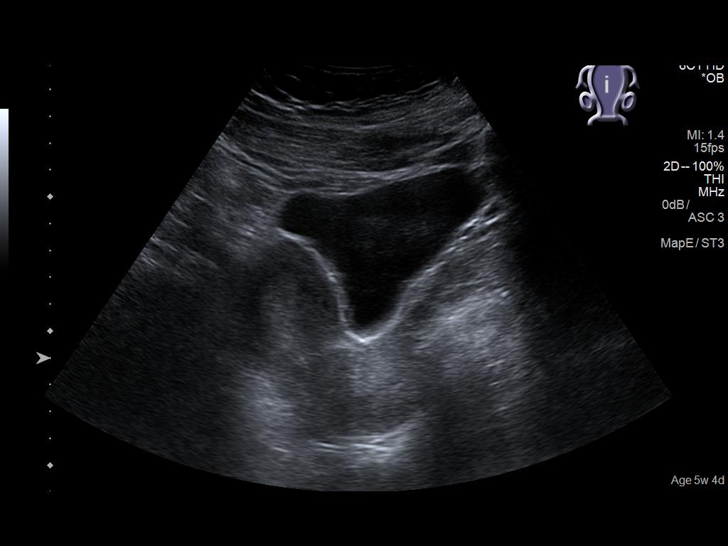
[im 8/64]
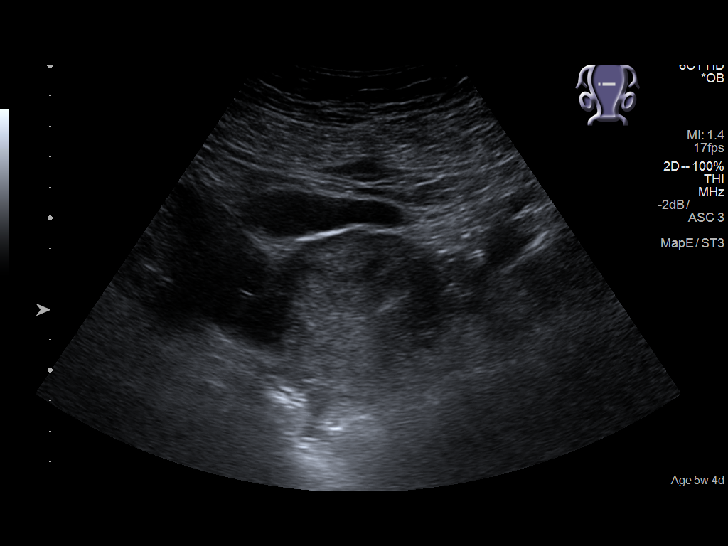
[im 12/64]
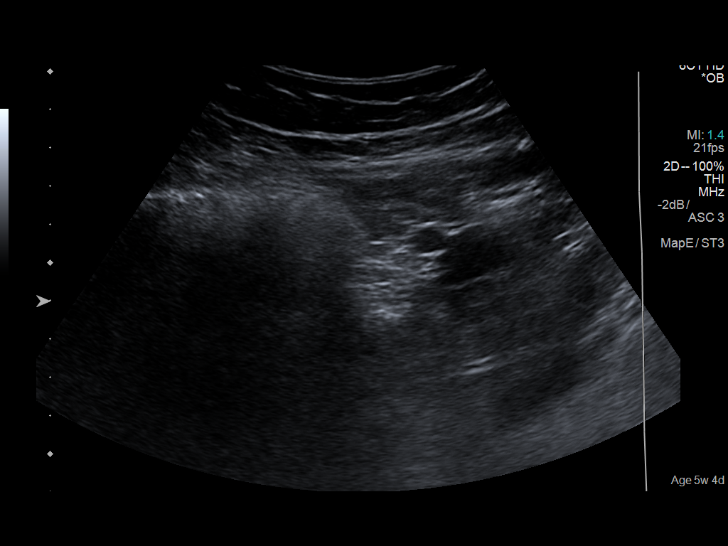
[im 17/64]
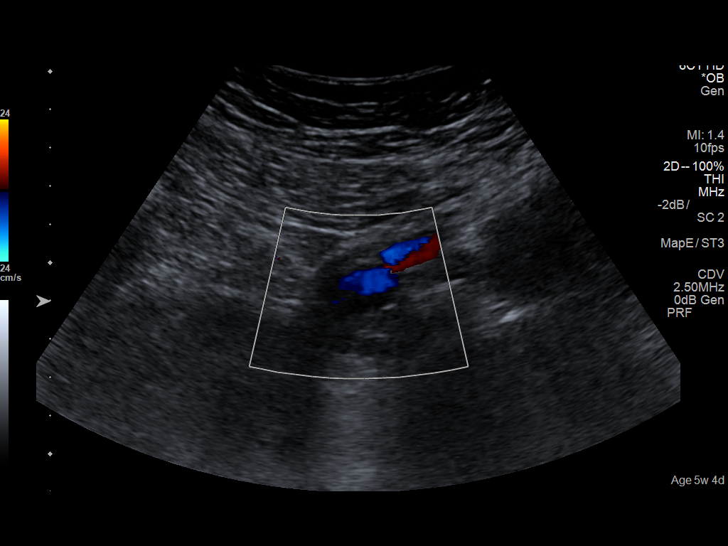
[im 22/64]
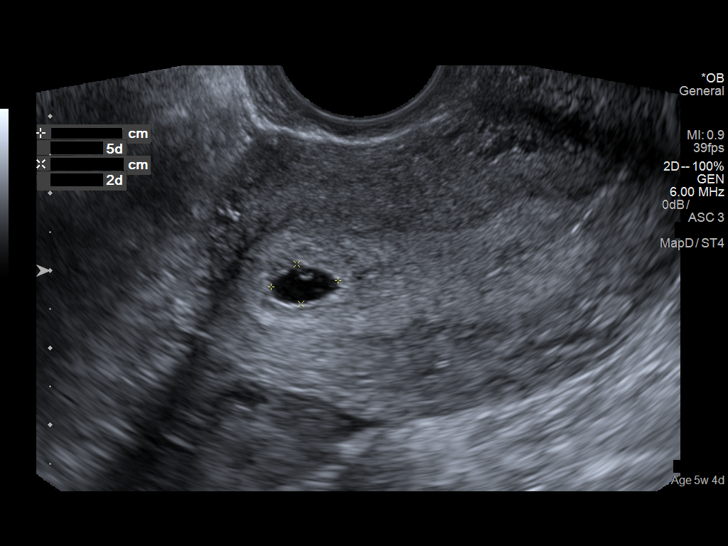
[im 26/64]
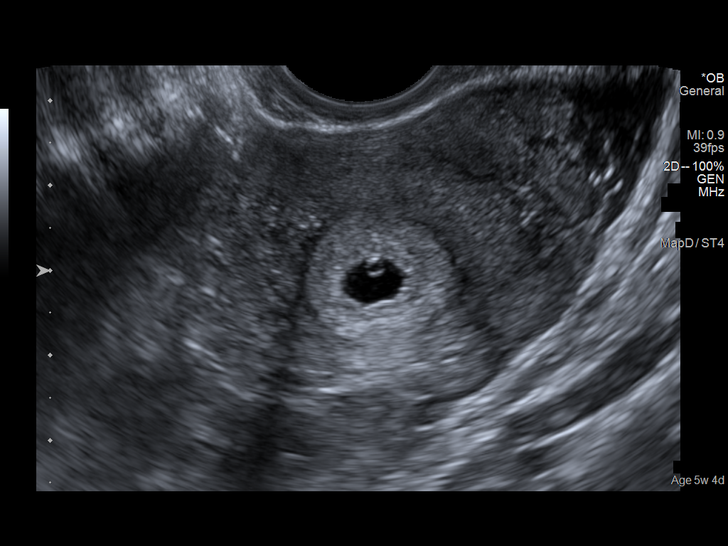
[im 31/64]
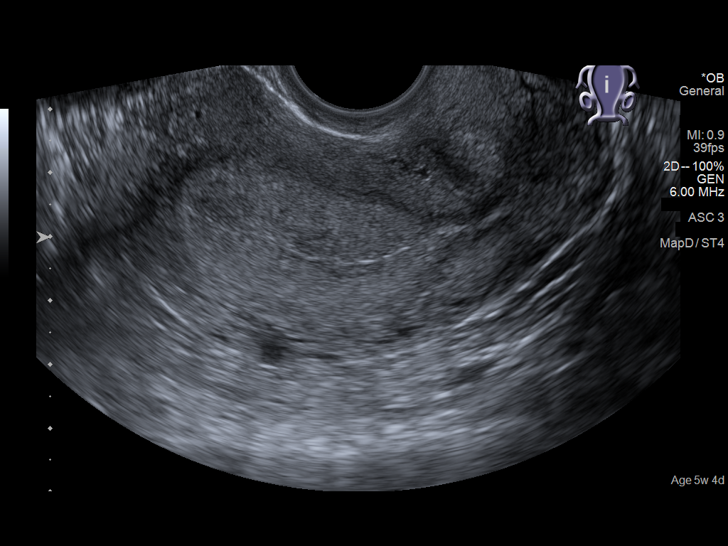
[im 36/64]
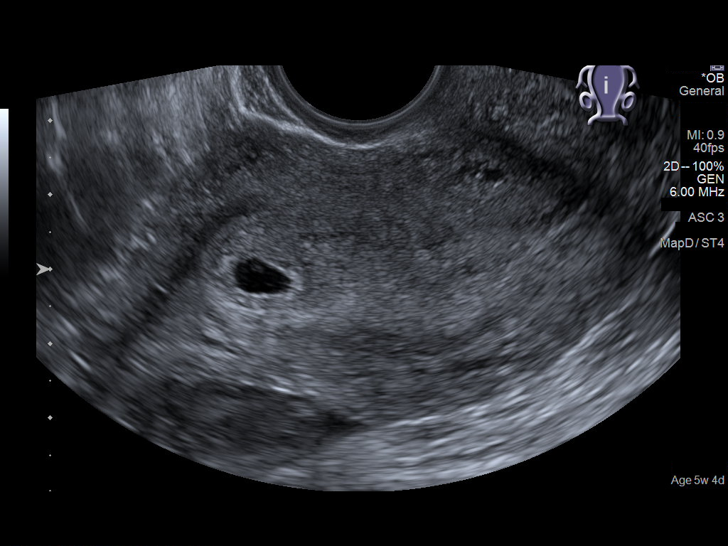
[im 40/64]
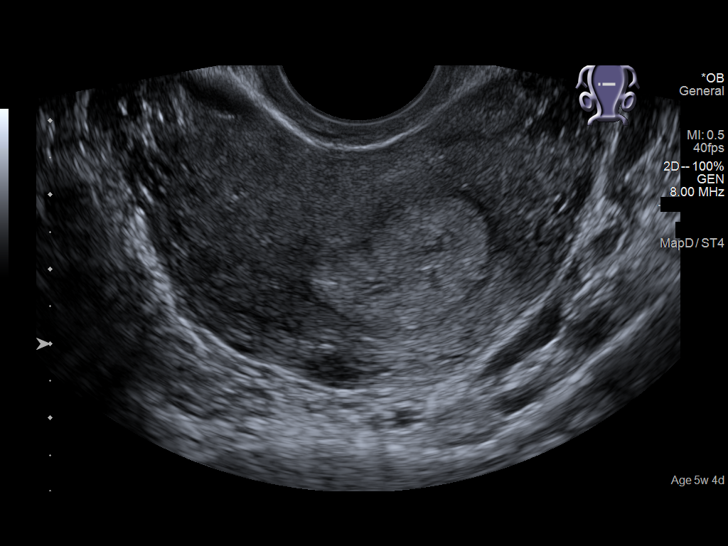
[im 45/64]
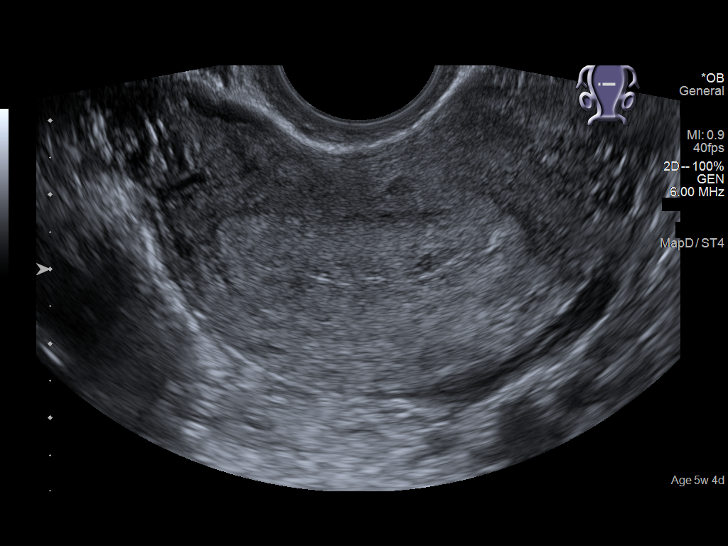
[im 50/64]
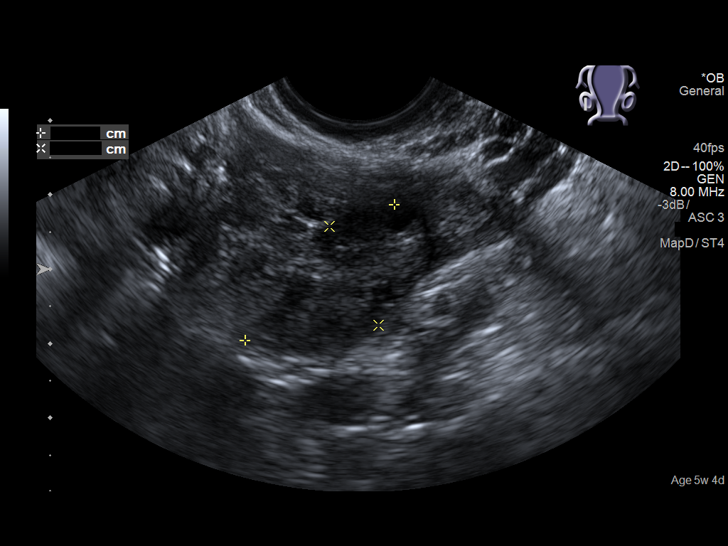
[im 54/64]
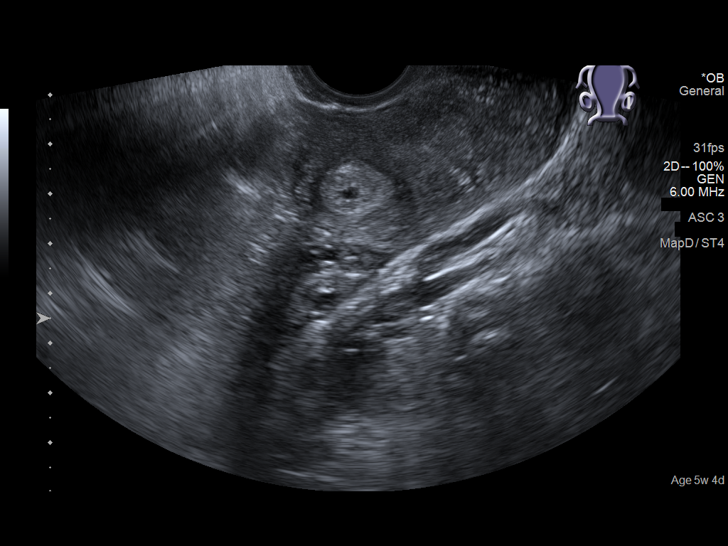
[im 59/64]
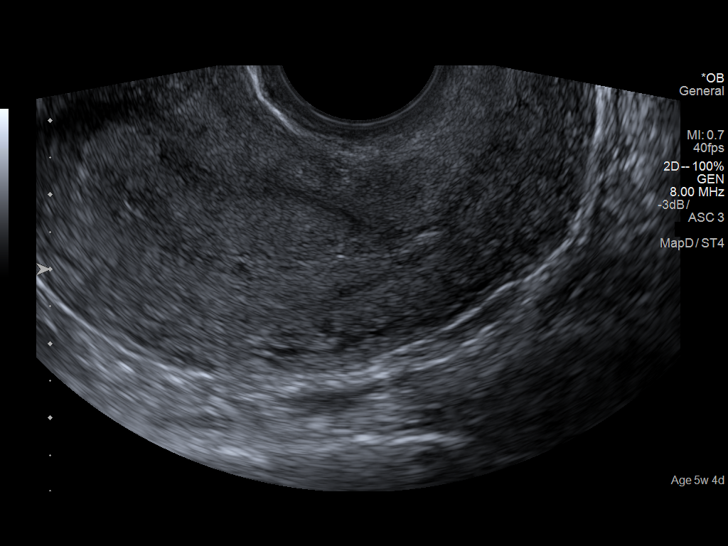
[im 64/64]
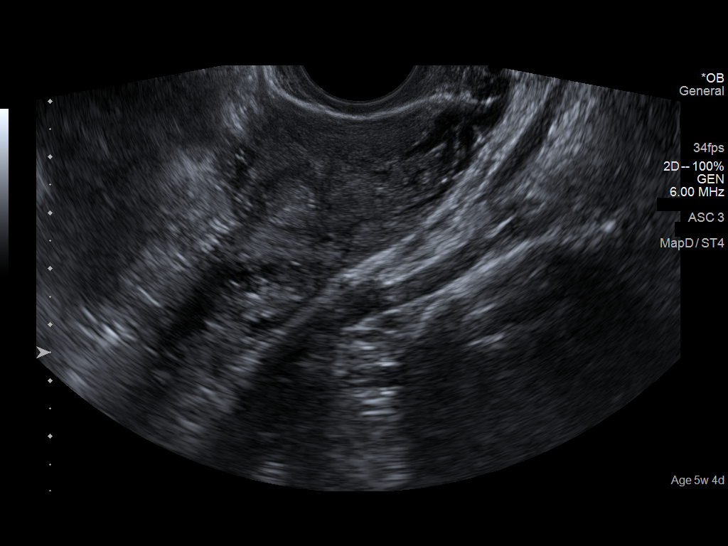

[14 of 28 positions shown; findings below may reference images not displayed]

FINDINGS: Intrauterine gestational sac: Single

Yolk sac:  Visualized.

Embryo:  Not Visualized.

MSD: 7.3 mm   5 w   3 d

Subchorionic hemorrhage:  None visualized.

Maternal uterus/adnexae: Probable small fibroid in the posterior
uterus. The ovaries are normal in appearance.
IMPRESSION: Single IUP. A gestational sac and yolk sac are seen. No fetal pole
at this time.
